# Patient Record
Sex: Female | Born: 1975 | Race: White | Hispanic: No | Marital: Single | Smoking: Never smoker
Health system: Southern US, Community
[De-identification: ages and names within clinical notes are randomized; demographics above are authoritative.]

## PROBLEM LIST (undated history)

## (undated) DIAGNOSIS — H04123 Dry eye syndrome of bilateral lacrimal glands: Secondary | ICD-10-CM

## (undated) DIAGNOSIS — L603 Nail dystrophy: Secondary | ICD-10-CM

## (undated) DIAGNOSIS — H269 Unspecified cataract: Secondary | ICD-10-CM

## (undated) DIAGNOSIS — B351 Tinea unguium: Secondary | ICD-10-CM

## (undated) DIAGNOSIS — M6281 Muscle weakness (generalized): Secondary | ICD-10-CM

## (undated) DIAGNOSIS — R600 Localized edema: Secondary | ICD-10-CM

## (undated) DIAGNOSIS — E119 Type 2 diabetes mellitus without complications: Secondary | ICD-10-CM

## (undated) DIAGNOSIS — I4891 Unspecified atrial fibrillation: Secondary | ICD-10-CM

## (undated) DIAGNOSIS — K219 Gastro-esophageal reflux disease without esophagitis: Secondary | ICD-10-CM

## (undated) DIAGNOSIS — L219 Seborrheic dermatitis, unspecified: Secondary | ICD-10-CM

## (undated) DIAGNOSIS — F419 Anxiety disorder, unspecified: Secondary | ICD-10-CM

## (undated) DIAGNOSIS — G809 Cerebral palsy, unspecified: Secondary | ICD-10-CM

## (undated) DIAGNOSIS — G8929 Other chronic pain: Secondary | ICD-10-CM

## (undated) DIAGNOSIS — F329 Major depressive disorder, single episode, unspecified: Secondary | ICD-10-CM

## (undated) DIAGNOSIS — I509 Heart failure, unspecified: Secondary | ICD-10-CM

## (undated) DIAGNOSIS — F79 Unspecified intellectual disabilities: Secondary | ICD-10-CM

## (undated) DIAGNOSIS — M810 Age-related osteoporosis without current pathological fracture: Secondary | ICD-10-CM

## (undated) DIAGNOSIS — I739 Peripheral vascular disease, unspecified: Secondary | ICD-10-CM

## (undated) DIAGNOSIS — H524 Presbyopia: Secondary | ICD-10-CM

## (undated) DIAGNOSIS — R293 Abnormal posture: Secondary | ICD-10-CM

## (undated) DIAGNOSIS — G40909 Epilepsy, unspecified, not intractable, without status epilepticus: Secondary | ICD-10-CM

## (undated) DIAGNOSIS — J309 Allergic rhinitis, unspecified: Secondary | ICD-10-CM

## (undated) DIAGNOSIS — F1320 Sedative, hypnotic or anxiolytic dependence, uncomplicated: Secondary | ICD-10-CM

## (undated) DIAGNOSIS — F29 Unspecified psychosis not due to a substance or known physiological condition: Secondary | ICD-10-CM

## (undated) DIAGNOSIS — F1120 Opioid dependence, uncomplicated: Secondary | ICD-10-CM

## (undated) DIAGNOSIS — J329 Chronic sinusitis, unspecified: Secondary | ICD-10-CM

## (undated) DIAGNOSIS — I10 Essential (primary) hypertension: Secondary | ICD-10-CM

## (undated) DIAGNOSIS — J449 Chronic obstructive pulmonary disease, unspecified: Secondary | ICD-10-CM

## (undated) DIAGNOSIS — E669 Obesity, unspecified: Secondary | ICD-10-CM

## (undated) DIAGNOSIS — R41841 Cognitive communication deficit: Secondary | ICD-10-CM

## (undated) DIAGNOSIS — M24573 Contracture, unspecified ankle: Secondary | ICD-10-CM

## (undated) DIAGNOSIS — B372 Candidiasis of skin and nail: Secondary | ICD-10-CM

## (undated) DIAGNOSIS — M419 Scoliosis, unspecified: Secondary | ICD-10-CM

## (undated) DIAGNOSIS — R1312 Dysphagia, oropharyngeal phase: Secondary | ICD-10-CM

## (undated) DIAGNOSIS — R011 Cardiac murmur, unspecified: Secondary | ICD-10-CM

## (undated) DIAGNOSIS — R569 Unspecified convulsions: Secondary | ICD-10-CM

## (undated) DIAGNOSIS — G894 Chronic pain syndrome: Secondary | ICD-10-CM

## (undated) DIAGNOSIS — R532 Functional quadriplegia: Secondary | ICD-10-CM

---

## 1998-11-30 ENCOUNTER — Ambulatory Visit (HOSPITAL_COMMUNITY): Admission: RE | Admit: 1998-11-30 | Discharge: 1998-11-30 | Payer: Self-pay | Admitting: *Deleted

## 1999-01-27 ENCOUNTER — Emergency Department (HOSPITAL_COMMUNITY): Admission: EM | Admit: 1999-01-27 | Discharge: 1999-01-27 | Payer: Self-pay | Admitting: *Deleted

## 2000-03-02 ENCOUNTER — Emergency Department (HOSPITAL_COMMUNITY): Admission: EM | Admit: 2000-03-02 | Discharge: 2000-03-03 | Payer: Self-pay | Admitting: Emergency Medicine

## 2000-03-03 ENCOUNTER — Encounter: Payer: Self-pay | Admitting: Emergency Medicine

## 2017-05-22 ENCOUNTER — Ambulatory Visit (HOSPITAL_COMMUNITY)
Admission: RE | Admit: 2017-05-22 | Discharge: 2017-05-22 | Disposition: A | Discharge status: Home / Self Care | Payer: Medicare Other | Source: Ambulatory Visit | Attending: Cardiovascular Disease | Admitting: Cardiovascular Disease

## 2017-05-22 ENCOUNTER — Ambulatory Visit (INDEPENDENT_AMBULATORY_CARE_PROVIDER_SITE_OTHER): Payer: Medicare Other | Admitting: Cardiovascular Disease

## 2017-05-22 ENCOUNTER — Encounter: Payer: Self-pay | Admitting: Cardiovascular Disease

## 2017-05-22 VITALS — BP 117/81 | HR 102

## 2017-05-22 DIAGNOSIS — I110 Hypertensive heart disease with heart failure: Secondary | ICD-10-CM | POA: Insufficient documentation

## 2017-05-22 DIAGNOSIS — I10 Essential (primary) hypertension: Secondary | ICD-10-CM | POA: Diagnosis not present

## 2017-05-22 DIAGNOSIS — I340 Nonrheumatic mitral (valve) insufficiency: Secondary | ICD-10-CM | POA: Insufficient documentation

## 2017-05-22 DIAGNOSIS — I4891 Unspecified atrial fibrillation: Secondary | ICD-10-CM

## 2017-05-22 DIAGNOSIS — R569 Unspecified convulsions: Secondary | ICD-10-CM | POA: Insufficient documentation

## 2017-05-22 DIAGNOSIS — R7989 Other specified abnormal findings of blood chemistry: Secondary | ICD-10-CM

## 2017-05-22 DIAGNOSIS — I509 Heart failure, unspecified: Secondary | ICD-10-CM | POA: Diagnosis not present

## 2017-05-22 DIAGNOSIS — G8250 Quadriplegia, unspecified: Secondary | ICD-10-CM | POA: Diagnosis not present

## 2017-05-22 DIAGNOSIS — R011 Cardiac murmur, unspecified: Secondary | ICD-10-CM | POA: Diagnosis present

## 2017-05-22 LAB — ECHOCARDIOGRAM COMPLETE

## 2017-05-22 MED ORDER — METOPROLOL TARTRATE 25 MG PO TABS: 25.0000 mg | ORAL_TABLET | Freq: Two times a day (BID) | ORAL | 3 refills | Status: AC

## 2017-05-22 NOTE — Addendum Note (Signed)
Addended by: Abelino Derrick R on: 05/22/2017 03:09 PM   Modules accepted: Orders

## 2017-05-22 NOTE — Patient Instructions (Signed)
Medication Instructions:  STOP LISINOPRIL  START METOPROLOL TO 25 MG - TAKE 1 TABLET TWICE DAILY   Labwork: NONE  Testing/Procedures: Your physician has requested that you have an echocardiogram. Echocardiography is a painless test that uses sound waves to create images of your heart. It provides your doctor with information about the size and shape of your heart and how well your heart's chambers and valves are working. This procedure takes approximately one hour. There are no restrictions for this procedure.    Follow-Up: Your physician recommends that you schedule a follow-up appointment in: 3 MONTHS    Any Other Special Instructions Will Be Listed Below (If Applicable).     If you need a refill on your cardiac medications before your next appointment, please call your pharmacy.

## 2017-05-22 NOTE — Progress Notes (Signed)
CARDIOLOGY CONSULT NOTE  Patient ID: Hailey Gutierrez MRN: [id number] DOB/AGE: 09/12/1975 41 y.o.  Admit date: (Not on file) Primary Physician: Bernerd Limbo, MD Referring Physician: Dr. Eden Emms  Reason for Consultation: CHF  HPI: Hailey Gutierrez is a 41 y.o. female who is being seen today for the evaluation of CHF at the request of Bernerd Limbo*.   Unfortunately, I have no medical records. She resides at [facility].  Labs performed on 05/16/17 showed BUN 12, creatinine 0.53, sodium 135, potassium 4.5, BNP 248. BNP on 9/18 was 474. She is currently on Lasix 20 mg daily.  It appears she has a history of hypertension and seizures based upon review of her medication list.  ECG performed in the office today which I ordered and personally interpreted demonstrated rapid atrial fibrillation, 177 bpm.  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  She is here with a cousin. She had apparently spent some time at a rehabilitation facility in [facility].  The patient denies chest pain and shortness of breath. She is tearful and says "please don't put me in the hospital ".  On physical examination, she has a loud systolic murmur but her heart rate is 108 bpm. The patient denies palpitations. She denies a history of heart disease and does not recall ever being told she has a murmur.  Repeat ECG which I ordered and personally interpreted demonstrated sinus tachycardia, 102 beats per minutes, with probable left atrial enlargement.   Not on File  No current outpatient prescriptions on file.   No current facility-administered medications for this visit.     No past medical history on file.  No past surgical history on file.  Social History   Social History  . Marital status: Single    Spouse name: N/A  . Number of children: N/A  . Years of education: N/A   Occupational History  . Not on file.   Social History Main Topics  . Smoking status: Never Smoker   . Smokeless tobacco: Never Used  . Alcohol use No  . Drug use: No  . Sexual activity: Not on file   Other Topics Concern  . Not on file   Social History Narrative  . No narrative on file     No family history of premature CAD in 1st degree relatives.  No outpatient prescriptions have been marked as taking for the 05/22/17 encounter (Office Visit) with Laqueta Linden, MD.      Review of systems complete and found to be negative unless listed above in HPI    Physical exam Blood pressure 117/81, pulse (!) 102, SpO2 96 %. General: NAD Neck: difficult to assess JVP  Lungs: Clear to auscultation anteriorly CV: Mildly tachycardic, regular rhythm, normal S1/S2, no S3/S4, 3/6 pansystolic murmur heard throughout precordium.  No leg edema.   Abdomen: Soft, nontender, protuberant. Skin: Intact without lesions or rashes.  Neurologic: Alert.  Extremities: Upper extremity contractures.  HEENT: Atraumatic.   ECG: Most recent ECG reviewed.   Labs: No results found for: K, BUN, CREATININE, ALT, TSH, HGB   Lipids: No results found for: LDLCALC, LDLDIRECT, CHOL, TRIG, HDL      ASSESSMENT AND PLAN:  1. Rapid atrial fibrillation: She is asymptomatic. Repeat ECG demonstrated sinus tachycardia, 102 bpm. She does not require anticoagulation. She is on aspirin for unclear reasons. I will discontinue lisinopril and start metoprolol titrate 25 mg twice daily. I will order a 2-D echocardiogram with Doppler to  evaluate cardiac structure, function, and regional wall motion.  2. Murmur: I will order a 2-D echocardiogram with Doppler to evaluate cardiac structure, function, and regional wall motion.  3. Elevated BNP/CHF: Currently on Lasix 20 mg daily ordered by another provider. She may have had fluid retention due to rapid atrial fibrillation. As stated above, I will stop lisinopril and start metoprolol 25 mg twice daily. I will order a 2-D echocardiogram with Doppler to evaluate cardiac  structure, function, and regional wall motion.    Disposition: Follow up in 3 months. I will try to obtain outside medical records from the rehab facility in [facility] for review.   Signed: Prentice Docker, M.D., F.A.C.C.  05/22/2017, 2:37 PM

## 2017-05-22 NOTE — Progress Notes (Signed)
*  PRELIMINARY RESULTS* Echocardiogram 2D Echocardiogram has been performed.  Jeryl Columbia 05/22/2017, 4:09 PM

## 2018-01-22 ENCOUNTER — Other Ambulatory Visit: Payer: Self-pay

## 2018-01-22 ENCOUNTER — Inpatient Hospital Stay (HOSPITAL_COMMUNITY)
Admission: EM | Admit: 2018-01-22 | Discharge: 2018-01-26 | DRG: 871 | Disposition: A | Discharge status: Skilled Nursing Facility | Payer: Medicare Other | Source: Skilled Nursing Facility | Attending: Internal Medicine | Admitting: Internal Medicine

## 2018-01-22 ENCOUNTER — Emergency Department (HOSPITAL_COMMUNITY): Payer: Medicare Other

## 2018-01-22 ENCOUNTER — Encounter (HOSPITAL_COMMUNITY): Payer: Self-pay | Admitting: Emergency Medicine

## 2018-01-22 DIAGNOSIS — R1310 Dysphagia, unspecified: Secondary | ICD-10-CM | POA: Diagnosis present

## 2018-01-22 DIAGNOSIS — J449 Chronic obstructive pulmonary disease, unspecified: Secondary | ICD-10-CM | POA: Diagnosis not present

## 2018-01-22 DIAGNOSIS — G9341 Metabolic encephalopathy: Secondary | ICD-10-CM | POA: Diagnosis not present

## 2018-01-22 DIAGNOSIS — M810 Age-related osteoporosis without current pathological fracture: Secondary | ICD-10-CM | POA: Diagnosis present

## 2018-01-22 DIAGNOSIS — Z8349 Family history of other endocrine, nutritional and metabolic diseases: Secondary | ICD-10-CM

## 2018-01-22 DIAGNOSIS — R532 Functional quadriplegia: Secondary | ICD-10-CM | POA: Diagnosis present

## 2018-01-22 DIAGNOSIS — R778 Other specified abnormalities of plasma proteins: Secondary | ICD-10-CM | POA: Diagnosis present

## 2018-01-22 DIAGNOSIS — R7989 Other specified abnormal findings of blood chemistry: Secondary | ICD-10-CM | POA: Diagnosis present

## 2018-01-22 DIAGNOSIS — I509 Heart failure, unspecified: Secondary | ICD-10-CM

## 2018-01-22 DIAGNOSIS — I10 Essential (primary) hypertension: Secondary | ICD-10-CM | POA: Diagnosis not present

## 2018-01-22 DIAGNOSIS — A4159 Other Gram-negative sepsis: Secondary | ICD-10-CM | POA: Diagnosis present

## 2018-01-22 DIAGNOSIS — Z833 Family history of diabetes mellitus: Secondary | ICD-10-CM

## 2018-01-22 DIAGNOSIS — R4182 Altered mental status, unspecified: Secondary | ICD-10-CM

## 2018-01-22 DIAGNOSIS — R1312 Dysphagia, oropharyngeal phase: Secondary | ICD-10-CM | POA: Diagnosis present

## 2018-01-22 DIAGNOSIS — I739 Peripheral vascular disease, unspecified: Secondary | ICD-10-CM | POA: Diagnosis present

## 2018-01-22 DIAGNOSIS — J690 Pneumonitis due to inhalation of food and vomit: Secondary | ICD-10-CM | POA: Diagnosis present

## 2018-01-22 DIAGNOSIS — I248 Other forms of acute ischemic heart disease: Secondary | ICD-10-CM | POA: Diagnosis present

## 2018-01-22 DIAGNOSIS — A419 Sepsis, unspecified organism: Secondary | ICD-10-CM

## 2018-01-22 DIAGNOSIS — N3000 Acute cystitis without hematuria: Secondary | ICD-10-CM

## 2018-01-22 DIAGNOSIS — G809 Cerebral palsy, unspecified: Secondary | ICD-10-CM | POA: Diagnosis not present

## 2018-01-22 DIAGNOSIS — N390 Urinary tract infection, site not specified: Secondary | ICD-10-CM | POA: Diagnosis not present

## 2018-01-22 DIAGNOSIS — B961 Klebsiella pneumoniae [K. pneumoniae] as the cause of diseases classified elsewhere: Secondary | ICD-10-CM | POA: Diagnosis present

## 2018-01-22 DIAGNOSIS — A4151 Sepsis due to Escherichia coli [E. coli]: Secondary | ICD-10-CM | POA: Diagnosis not present

## 2018-01-22 DIAGNOSIS — Z1612 Extended spectrum beta lactamase (ESBL) resistance: Secondary | ICD-10-CM | POA: Diagnosis present

## 2018-01-22 DIAGNOSIS — Z79891 Long term (current) use of opiate analgesic: Secondary | ICD-10-CM

## 2018-01-22 DIAGNOSIS — F329 Major depressive disorder, single episode, unspecified: Secondary | ICD-10-CM | POA: Diagnosis present

## 2018-01-22 DIAGNOSIS — R748 Abnormal levels of other serum enzymes: Secondary | ICD-10-CM | POA: Diagnosis not present

## 2018-01-22 DIAGNOSIS — F419 Anxiety disorder, unspecified: Secondary | ICD-10-CM | POA: Diagnosis present

## 2018-01-22 DIAGNOSIS — Z8249 Family history of ischemic heart disease and other diseases of the circulatory system: Secondary | ICD-10-CM

## 2018-01-22 DIAGNOSIS — F79 Unspecified intellectual disabilities: Secondary | ICD-10-CM | POA: Diagnosis not present

## 2018-01-22 DIAGNOSIS — Z66 Do not resuscitate: Secondary | ICD-10-CM | POA: Diagnosis present

## 2018-01-22 DIAGNOSIS — Z7982 Long term (current) use of aspirin: Secondary | ICD-10-CM

## 2018-01-22 DIAGNOSIS — I5032 Chronic diastolic (congestive) heart failure: Secondary | ICD-10-CM | POA: Diagnosis present

## 2018-01-22 DIAGNOSIS — I110 Hypertensive heart disease with heart failure: Secondary | ICD-10-CM | POA: Diagnosis present

## 2018-01-22 DIAGNOSIS — G40909 Epilepsy, unspecified, not intractable, without status epilepticus: Secondary | ICD-10-CM | POA: Diagnosis present

## 2018-01-22 DIAGNOSIS — Z841 Family history of disorders of kidney and ureter: Secondary | ICD-10-CM

## 2018-01-22 DIAGNOSIS — E1151 Type 2 diabetes mellitus with diabetic peripheral angiopathy without gangrene: Secondary | ICD-10-CM | POA: Diagnosis present

## 2018-01-22 DIAGNOSIS — R569 Unspecified convulsions: Secondary | ICD-10-CM

## 2018-01-22 DIAGNOSIS — Z7401 Bed confinement status: Secondary | ICD-10-CM

## 2018-01-22 HISTORY — DX: Major depressive disorder, single episode, unspecified: F32.9

## 2018-01-22 HISTORY — DX: Unspecified atrial fibrillation: I48.91

## 2018-01-22 HISTORY — DX: Abnormal posture: R29.3

## 2018-01-22 HISTORY — DX: Allergic rhinitis, unspecified: J30.9

## 2018-01-22 HISTORY — DX: Cerebral palsy, unspecified: G80.9

## 2018-01-22 HISTORY — DX: Age-related osteoporosis without current pathological fracture: M81.0

## 2018-01-22 HISTORY — DX: Nail dystrophy: L60.3

## 2018-01-22 HISTORY — DX: Chronic pain syndrome: G89.4

## 2018-01-22 HISTORY — DX: Presbyopia: H52.4

## 2018-01-22 HISTORY — DX: Seborrheic dermatitis, unspecified: L21.9

## 2018-01-22 HISTORY — DX: Heart failure, unspecified: I50.9

## 2018-01-22 HISTORY — DX: Cardiac murmur, unspecified: R01.1

## 2018-01-22 HISTORY — DX: Tinea unguium: B35.1

## 2018-01-22 HISTORY — DX: Dysphagia, oropharyngeal phase: R13.12

## 2018-01-22 HISTORY — DX: Type 2 diabetes mellitus without complications: E11.9

## 2018-01-22 HISTORY — DX: Chronic sinusitis, unspecified: J32.9

## 2018-01-22 HISTORY — DX: Epilepsy, unspecified, not intractable, without status epilepticus: G40.909

## 2018-01-22 HISTORY — DX: Unspecified cataract: H26.9

## 2018-01-22 HISTORY — DX: Unspecified convulsions: R56.9

## 2018-01-22 HISTORY — DX: Candidiasis of skin and nail: B37.2

## 2018-01-22 HISTORY — DX: Chronic obstructive pulmonary disease, unspecified: J44.9

## 2018-01-22 HISTORY — DX: Gastro-esophageal reflux disease without esophagitis: K21.9

## 2018-01-22 HISTORY — DX: Sedative, hypnotic or anxiolytic dependence, uncomplicated: F13.20

## 2018-01-22 HISTORY — DX: Unspecified psychosis not due to a substance or known physiological condition: F29

## 2018-01-22 HISTORY — DX: Other chronic pain: G89.29

## 2018-01-22 HISTORY — DX: Peripheral vascular disease, unspecified: I73.9

## 2018-01-22 HISTORY — DX: Obesity, unspecified: E66.9

## 2018-01-22 HISTORY — DX: Opioid dependence, uncomplicated: F11.20

## 2018-01-22 HISTORY — DX: Anxiety disorder, unspecified: F41.9

## 2018-01-22 HISTORY — DX: Functional quadriplegia: R53.2

## 2018-01-22 HISTORY — DX: Localized edema: R60.0

## 2018-01-22 HISTORY — DX: Contracture, unspecified ankle: M24.573

## 2018-01-22 HISTORY — DX: Cognitive communication deficit: R41.841

## 2018-01-22 HISTORY — DX: Essential (primary) hypertension: I10

## 2018-01-22 HISTORY — DX: Scoliosis, unspecified: M41.9

## 2018-01-22 HISTORY — DX: Muscle weakness (generalized): M62.81

## 2018-01-22 HISTORY — DX: Dry eye syndrome of bilateral lacrimal glands: H04.123

## 2018-01-22 HISTORY — DX: Unspecified intellectual disabilities: F79

## 2018-01-22 LAB — URINALYSIS, ROUTINE W REFLEX MICROSCOPIC
Bilirubin Urine: NEGATIVE
GLUCOSE, UA: NEGATIVE mg/dL
KETONES UR: NEGATIVE mg/dL
NITRITE: POSITIVE — AB
PROTEIN: 100 mg/dL — AB
Specific Gravity, Urine: 1.016 (ref 1.005–1.030)
pH: 5 (ref 5.0–8.0)

## 2018-01-22 LAB — COMPREHENSIVE METABOLIC PANEL
ALT: 12 U/L — ABNORMAL LOW (ref 14–54)
AST: 22 U/L (ref 15–41)
Albumin: 3.7 g/dL (ref 3.5–5.0)
Alkaline Phosphatase: 105 U/L (ref 38–126)
Anion gap: 13 (ref 5–15)
BUN: 17 mg/dL (ref 6–20)
CHLORIDE: 108 mmol/L (ref 101–111)
CO2: 21 mmol/L — ABNORMAL LOW (ref 22–32)
Calcium: 9.2 mg/dL (ref 8.9–10.3)
Creatinine, Ser: 0.73 mg/dL (ref 0.44–1.00)
Glucose, Bld: 136 mg/dL — ABNORMAL HIGH (ref 65–99)
POTASSIUM: 4 mmol/L (ref 3.5–5.1)
Sodium: 142 mmol/L (ref 135–145)
Total Bilirubin: 0.7 mg/dL (ref 0.3–1.2)
Total Protein: 8 g/dL (ref 6.5–8.1)

## 2018-01-22 LAB — CBC WITH DIFFERENTIAL/PLATELET
BASOS PCT: 0 %
Basophils Absolute: 0 10*3/uL (ref 0.0–0.1)
Eosinophils Absolute: 0 10*3/uL (ref 0.0–0.7)
Eosinophils Relative: 0 %
HEMATOCRIT: 42.3 % (ref 36.0–46.0)
Hemoglobin: 13.4 g/dL (ref 12.0–15.0)
Lymphocytes Relative: 6 %
Lymphs Abs: 0.8 10*3/uL (ref 0.7–4.0)
MCH: 29.2 pg (ref 26.0–34.0)
MCHC: 31.7 g/dL (ref 30.0–36.0)
MCV: 92.2 fL (ref 78.0–100.0)
MONO ABS: 1 10*3/uL (ref 0.1–1.0)
MONOS PCT: 7 %
NEUTROS ABS: 11.9 10*3/uL — AB (ref 1.7–7.7)
Neutrophils Relative %: 87 %
Platelets: 220 10*3/uL (ref 150–400)
RBC: 4.59 MIL/uL (ref 3.87–5.11)
RDW: 14.6 % (ref 11.5–15.5)
WBC: 13.7 10*3/uL — ABNORMAL HIGH (ref 4.0–10.5)

## 2018-01-22 LAB — CBG MONITORING, ED: GLUCOSE-CAPILLARY: 124 mg/dL — AB (ref 65–99)

## 2018-01-22 LAB — TROPONIN I
Troponin I: 0.13 ng/mL (ref <0.03)
Troponin I: 0.16 ng/mL (ref <0.03)

## 2018-01-22 MED ORDER — LISINOPRIL 5 MG PO TABS
2.5000 mg | ORAL_TABLET | Freq: Every day | ORAL | Status: DC
  Administered 2018-01-23: 2.5 mg via ORAL
  Filled 2018-01-22: qty 1

## 2018-01-22 MED ORDER — SALINE SPRAY 0.65 % NA SOLN: 1.0000 | NASAL | Status: DC | PRN

## 2018-01-22 MED ORDER — FUROSEMIDE 20 MG PO TABS
20.0000 mg | ORAL_TABLET | Freq: Every day | ORAL | Status: DC
  Administered 2018-01-23: 20 mg via ORAL
  Filled 2018-01-22: qty 1

## 2018-01-22 MED ORDER — BACLOFEN 10 MG PO TABS
10.0000 mg | ORAL_TABLET | Freq: Three times a day (TID) | ORAL | Status: DC
  Administered 2018-01-22 – 2018-01-26 (×11): 10 mg via ORAL
  Filled 2018-01-22 (×11): qty 1

## 2018-01-22 MED ORDER — POLYETHYLENE GLYCOL 3350 17 G PO PACK
17.0000 g | PACK | Freq: Every day | ORAL | Status: DC
  Administered 2018-01-23 – 2018-01-26 (×4): 17 g via ORAL
  Filled 2018-01-22 (×4): qty 1

## 2018-01-22 MED ORDER — IPRATROPIUM-ALBUTEROL 0.5-2.5 (3) MG/3ML IN SOLN: 3.0000 mL | RESPIRATORY_TRACT | Status: DC | PRN

## 2018-01-22 MED ORDER — ACETAMINOPHEN 325 MG PO TABS
650.0000 mg | ORAL_TABLET | Freq: Once | ORAL | Status: AC
  Administered 2018-01-22: 650 mg via ORAL

## 2018-01-22 MED ORDER — CALCIUM CARBONATE ANTACID 500 MG PO CHEW
1.0000 | CHEWABLE_TABLET | Freq: Two times a day (BID) | ORAL | Status: DC
  Administered 2018-01-22 – 2018-01-26 (×8): 200 mg via ORAL
  Filled 2018-01-22 (×8): qty 1

## 2018-01-22 MED ORDER — ASPIRIN 81 MG PO CHEW
324.0000 mg | CHEWABLE_TABLET | Freq: Once | ORAL | Status: AC
  Administered 2018-01-22: 324 mg via ORAL
  Filled 2018-01-22: qty 4

## 2018-01-22 MED ORDER — SODIUM CHLORIDE 0.9 % IV SOLN: 250.0000 mL | INTRAVENOUS | Status: DC | PRN

## 2018-01-22 MED ORDER — SODIUM CHLORIDE 0.9 % IV SOLN
1.0000 g | Freq: Once | INTRAVENOUS | Status: AC
  Administered 2018-01-22: 1 g via INTRAVENOUS
  Filled 2018-01-22: qty 10

## 2018-01-22 MED ORDER — BUSPIRONE HCL 5 MG PO TABS
10.0000 mg | ORAL_TABLET | Freq: Three times a day (TID) | ORAL | Status: DC
Start: 2018-01-22 — End: 2018-01-26
  Administered 2018-01-22 – 2018-01-26 (×11): 10 mg via ORAL
  Filled 2018-01-22 (×8): qty 2
  Filled 2018-01-22 (×2): qty 1
  Filled 2018-01-22 (×3): qty 2

## 2018-01-22 MED ORDER — METOPROLOL TARTRATE 25 MG PO TABS
25.0000 mg | ORAL_TABLET | Freq: Two times a day (BID) | ORAL | Status: DC
  Administered 2018-01-22 – 2018-01-26 (×8): 25 mg via ORAL
  Filled 2018-01-22 (×8): qty 1

## 2018-01-22 MED ORDER — MONTELUKAST SODIUM 10 MG PO TABS
10.0000 mg | ORAL_TABLET | Freq: Every day | ORAL | Status: DC
  Administered 2018-01-22 – 2018-01-25 (×4): 10 mg via ORAL
  Filled 2018-01-22 (×4): qty 1

## 2018-01-22 MED ORDER — ESCITALOPRAM OXALATE 10 MG PO TABS
10.0000 mg | ORAL_TABLET | Freq: Every day | ORAL | Status: DC
  Administered 2018-01-23 – 2018-01-26 (×4): 10 mg via ORAL
  Filled 2018-01-22 (×4): qty 1

## 2018-01-22 MED ORDER — ACETAMINOPHEN 325 MG PO TABS
650.0000 mg | ORAL_TABLET | ORAL | Status: DC | PRN
  Administered 2018-01-23: 650 mg via ORAL
  Filled 2018-01-22 (×2): qty 2

## 2018-01-22 MED ORDER — SODIUM CHLORIDE 0.9% FLUSH
3.0000 mL | Freq: Two times a day (BID) | INTRAVENOUS | Status: DC
  Administered 2018-01-22 – 2018-01-26 (×8): 3 mL via INTRAVENOUS

## 2018-01-22 MED ORDER — ARIPIPRAZOLE 5 MG PO TABS
5.0000 mg | ORAL_TABLET | Freq: Every day | ORAL | Status: DC
  Administered 2018-01-22 – 2018-01-26 (×5): 5 mg via ORAL
  Filled 2018-01-22 (×5): qty 1

## 2018-01-22 MED ORDER — KETOCONAZOLE 2 % EX SHAM
1.0000 "application " | MEDICATED_SHAMPOO | CUTANEOUS | Status: DC
  Administered 2018-01-22 – 2018-01-25 (×2): 1 via TOPICAL
  Filled 2018-01-22 (×2): qty 120

## 2018-01-22 MED ORDER — MEDROXYPROGESTERONE ACETATE 150 MG/ML IM SUSP: 150.0000 mg | INTRAMUSCULAR | Status: DC

## 2018-01-22 MED ORDER — SODIUM CHLORIDE 0.9 % IV SOLN
1.0000 g | INTRAVENOUS | Status: DC
  Administered 2018-01-23: 1 g via INTRAVENOUS
  Filled 2018-01-22: qty 1
  Filled 2018-01-22: qty 10

## 2018-01-22 MED ORDER — VITAMIN D 1000 UNITS PO TABS
5000.0000 [IU] | ORAL_TABLET | Freq: Every day | ORAL | Status: DC
  Administered 2018-01-23 – 2018-01-26 (×4): 5000 [IU] via ORAL
  Filled 2018-01-22 (×4): qty 5

## 2018-01-22 MED ORDER — OXYCODONE HCL 5 MG PO TABS
5.0000 mg | ORAL_TABLET | Freq: Three times a day (TID) | ORAL | Status: DC
  Administered 2018-01-22 – 2018-01-26 (×11): 5 mg via ORAL
  Filled 2018-01-22 (×12): qty 1

## 2018-01-22 MED ORDER — SODIUM CHLORIDE 0.9% FLUSH: 3.0000 mL | INTRAVENOUS | Status: DC | PRN

## 2018-01-22 MED ORDER — LEVETIRACETAM 100 MG/ML PO SOLN
500.0000 mg | Freq: Two times a day (BID) | ORAL | Status: DC
  Administered 2018-01-22 – 2018-01-26 (×8): 500 mg via ORAL
  Filled 2018-01-22 (×12): qty 5

## 2018-01-22 MED ORDER — DOCUSATE SODIUM 100 MG PO CAPS
100.0000 mg | ORAL_CAPSULE | Freq: Two times a day (BID) | ORAL | Status: DC
  Administered 2018-01-22 – 2018-01-26 (×8): 100 mg via ORAL
  Filled 2018-01-22 (×8): qty 1

## 2018-01-22 MED ORDER — ASPIRIN 81 MG PO CHEW
81.0000 mg | CHEWABLE_TABLET | Freq: Every day | ORAL | Status: DC
  Administered 2018-01-23 – 2018-01-26 (×4): 81 mg via ORAL
  Filled 2018-01-22 (×4): qty 1

## 2018-01-22 MED ORDER — OXYCODONE-ACETAMINOPHEN 5-325 MG PO TABS: 1.0000 | ORAL_TABLET | ORAL | Status: DC | PRN

## 2018-01-22 NOTE — ED Triage Notes (Addendum)
[facility] with altered mental status since last night. States she is normally verbal, alert and oriented. States patient is just moaning today and unable to communicate verbally. Patient shook head "no" when asked if she had pain.

## 2018-01-22 NOTE — H&P (Signed)
History and Physical    Hailey Gutierrez:[id number] DOB: 09-16-1975 DOA: 01/22/2018  PCP: Bernerd Limbo, MD  Patient coming from: [facility]  Chief Complaint: Altered mental status  HPI: Hailey Gutierrez is a 42 y.o. female with medical history significant of cerebral palsy, functional quadriplegic, mental retardation, hypertension, seizures, congestive heart failure, comes in with altered mental status this morning.  All history is obtained from her cousin who is present.  Patient is on various antibiotics at her nursing home with various different types of infections.  She comes in this morning because of altered mental status.  She does not require frequent hospitalization.  She has not had any nausea vomiting or diarrhea.  Her cousin says that her mental status is much improved since this morning.  Patient found to have UTI with a fever of 103 and referred for admission for such.  Patient cannot provide any history herself.  Review of Systems: As per HPI otherwise 10 point review of systems negative per cousin  Past Medical History:  Diagnosis Date  . Abnormal posture   . Allergic rhinitis   . Anxiety   . Atrial fibrillation (HCC)   . Candidiasis of skin and nail   . Cardiac murmur   . Cataract   . Cerebral palsy (HCC)   . CHF (congestive heart failure) (HCC)   . Chronic pain   . Chronic pain syndrome   . Chronic sinusitis   . Cognitive communication deficit   . Contracture, unspecified ankle   . COPD (chronic obstructive pulmonary disease) (HCC)   . Diabetes mellitus without complication (HCC)   . Dry eye syndrome of bilateral lacrimal glands   . Dysphagia, oropharyngeal phase   . Epilepsy (HCC)   . Essential hypertension   . Functional quadriplegia (HCC)   . GERD (gastroesophageal reflux disease)   . Heart failure (HCC)   . Localized edema   . Major depressive disorder   . Muscle weakness (generalized)   . Nail dystrophy   . Obesity   . Opioid  dependence (HCC)   . Osteoporosis   . Presbyopia   . PVD (peripheral vascular disease) (HCC)   . Scoliosis   . Seborrheic dermatitis   . Sedative hypnotic or anxiolytic dependence (HCC)   . Seizures (HCC)   . Sinusitis   . Tinea unguium   . Unspecified intellectual disabilities   . Unspecified psychosis not due to a substance or known physiological condition [facility])     History reviewed. No pertinent surgical history.   reports that she has never smoked. She has never used smokeless tobacco. She reports that she does not drink alcohol or use drugs.  No Known Allergies  Family History  Problem Relation Age of Onset  . Heart attack Mother   . Diabetes Mother   . Chronic Renal Failure Mother   . Hypertension Father   . Hyperlipidemia Father     Prior to Admission medications   Medication Sig Start Date End Date Taking? Authorizing Provider  acetaminophen (TYLENOL) 325 MG tablet Take 650 mg by mouth every 4 (four) hours as needed for fever (101 or higher).   Yes [provider]  ARIPiprazole (ABILIFY) 5 MG tablet Take 5 mg by mouth daily.   Yes [provider]  aspirin 81 MG chewable tablet Chew 81 mg by mouth daily.   Yes [provider]  baclofen (LIORESAL) 10 MG tablet Take 10 mg by mouth 3 (three) times daily.   Yes  [provider]  busPIRone (BUSPAR) 10 MG tablet Take 10 mg by mouth 3 (three) times daily.   Yes [provider]  calcium carbonate (TUMS - DOSED IN MG ELEMENTAL CALCIUM) 500 MG chewable tablet Chew 1 tablet by mouth 2 (two) times daily.   Yes [provider]  Cholecalciferol 5000 units capsule Take 5,000 Units by mouth daily.   Yes [provider]  docusate sodium (COLACE) 100 MG capsule Take 100 mg by mouth 2 (two) times daily.   Yes [provider]  escitalopram (LEXAPRO) 10 MG tablet Take 10 mg by mouth daily.   Yes [provider]  fexofenadine (ALLEGRA) 60 MG tablet Take 60 mg by  mouth 2 (two) times daily.   Yes [provider]  fluocinonide (LIDEX) 0.05 % external solution Apply 1 application topically daily as needed (itching in both ears).   Yes [provider]  fluticasone (FLONASE) 50 MCG/ACT nasal spray Place into both nostrils daily.   Yes [provider]  furosemide (LASIX) 20 MG tablet Take 20 mg by mouth.   Yes [provider]  ipratropium-albuterol (DUONEB) 0.5-2.5 (3) MG/3ML SOLN Take 3 mLs by nebulization every 4 (four) hours as needed (SOB or Wheezing).   Yes [provider]  ketoconazole (NIZORAL) 2 % shampoo Apply 1 application topically 2 (two) times a week. In the afternoon every Tuesday and friday   Yes [provider]  levETIRAcetam (KEPPRA) 100 MG/ML solution Take 500 mg by mouth 2 (two) times daily.   Yes [provider]  lisinopril (PRINIVIL,ZESTRIL) 2.5 MG tablet Take 2.5 mg by mouth daily.   Yes [provider]  medroxyPROGESTERone (DEPO-PROVERA) 150 MG/ML injection Inject 150 mg into the muscle every 3 (three) months.   Yes [provider]  metoprolol tartrate (LOPRESSOR) 25 MG tablet Take 1 tablet (25 mg total) by mouth 2 (two) times daily. 05/22/17 01/23/19 Yes Laqueta Linden, MD  montelukast (SINGULAIR) 10 MG tablet Take 10 mg by mouth at bedtime.   Yes [provider]  nystatin (MYCOSTATIN/NYSTOP) powder Apply 1 g topically 2 (two) times daily.   Yes [provider]  nystatin cream (MYCOSTATIN) Apply 1 application topically 2 (two) times daily. Apply to left cheek (face) until resolved   Yes [provider]  oxyCODONE (OXY IR/ROXICODONE) 5 MG immediate release tablet Take 5 mg by mouth 3 (three) times daily.   Yes [provider]  oxyCODONE-acetaminophen (PERCOCET/ROXICET) 5-325 MG tablet Take 1 tablet by mouth every 4 (four) hours as needed for severe pain.   Yes [provider]  polyethylene glycol (MIRALAX / GLYCOLAX)  packet Take 17 g by mouth daily.   Yes [provider]  ranitidine (ZANTAC) 75 MG tablet Take 75 mg by mouth 2 (two) times daily.   Yes [provider]  sodium chloride (OCEAN) 0.65 % SOLN nasal spray Place 1 spray into both nostrils as needed (nasal dryness).   Yes [provider]    Physical Exam: Vitals:   01/22/18 1515 01/22/18 1530 01/22/18 1718 01/22/18 1719  BP:   98/61   Pulse: 83 81  (!) 105  Resp: (!) 26 (!) 26 16 (!) 27  Temp:      TempSrc:      SpO2: 95% 96%  96%  Weight:      Height:        Constitutional: NAD, calm, comfortable Vitals:   01/22/18 1515 01/22/18 1530 01/22/18 1718 01/22/18 1719  BP:  98/61   Pulse: 83 81  (!) 105  Resp: (!) 26 (!) 26 16 (!) 27  Temp:      TempSrc:      SpO2: 95% 96%  96%  Weight:      Height:       Eyes: PERRL, lids and conjunctivae normal ENMT: Mucous membranes are moist. Posterior pharynx clear of any exudate or lesions.Normal dentition.  Neck: normal, supple, no masses, no thyromegaly Respiratory: clear to auscultation bilaterally, no wheezing, no crackles. Normal respiratory effort. No accessory muscle use.  Cardiovascular: Regular rate and rhythm, no murmurs / rubs / gallops. No extremity edema. 2+ pedal pulses. No carotid bruits.  Abdomen: no tenderness, no masses palpated. No hepatosplenomegaly. Bowel sounds positive.  Musculoskeletal: no clubbing / cyanosis. No joint deformity upper and lower extremities. Good ROM, no contractures. Normal muscle tone.  Skin: no rashes, lesions, ulcers. No induration Neurologic: CN 2-12 grossly intact. Sensation intact Psychiatric: Not normal judgment and insight. Alert and oriented x 1. Normal mood.    Labs on Admission: I have personally reviewed following labs and imaging studies  CBC: Recent Labs  Lab 01/22/18 1553  WBC 13.7*  NEUTROABS 11.9*  HGB 13.4  HCT 42.3  MCV 92.2  PLT 220   Basic Metabolic Panel: Recent Labs  Lab 01/22/18 1436  NA  142  K 4.0  CL 108  CO2 21*  GLUCOSE 136*  BUN 17  CREATININE 0.73  CALCIUM 9.2   GFR: Estimated Creatinine Clearance: 98.5 mL/min (by C-G formula based on SCr of 0.73 mg/dL). Liver Function Tests: Recent Labs  Lab 01/22/18 1436  AST 22  ALT 12*  ALKPHOS 105  BILITOT 0.7  PROT 8.0  ALBUMIN 3.7   No results for input(s): LIPASE, AMYLASE in the last 168 hours. No results for input(s): AMMONIA in the last 168 hours. Coagulation Profile: No results for input(s): INR, PROTIME in the last 168 hours. Cardiac Enzymes: Recent Labs  Lab 01/22/18 1436  TROPONINI 0.13*   BNP (last 3 results) No results for input(s): PROBNP in the last 8760 hours. HbA1C: No results for input(s): HGBA1C in the last 72 hours. CBG: Recent Labs  Lab 01/22/18 1425  GLUCAP 124*   Lipid Profile: No results for input(s): CHOL, HDL, LDLCALC, TRIG, CHOLHDL, LDLDIRECT in the last 72 hours. Thyroid Function Tests: No results for input(s): TSH, T4TOTAL, FREET4, T3FREE, THYROIDAB in the last 72 hours. Anemia Panel: No results for input(s): VITAMINB12, FOLATE, FERRITIN, TIBC, IRON, RETICCTPCT in the last 72 hours. Urine analysis:    Component Value Date/Time   COLORURINE AMBER (A) 01/22/2018 1321   APPEARANCEUR CLOUDY (A) 01/22/2018 1321   LABSPEC 1.016 01/22/2018 1321   PHURINE 5.0 01/22/2018 1321   GLUCOSEU NEGATIVE 01/22/2018 1321   HGBUR LARGE (A) 01/22/2018 1321   BILIRUBINUR NEGATIVE 01/22/2018 1321   KETONESUR NEGATIVE 01/22/2018 1321   PROTEINUR 100 (A) 01/22/2018 1321   NITRITE POSITIVE (A) 01/22/2018 1321   LEUKOCYTESUR SMALL (A) 01/22/2018 1321   Sepsis Labs: !!!!!!!!!!!!!!!!!!!!!!!!!!!!!!!!!!!!!!!!!!!! @LABRCNTIP (procalcitonin:4,lacticidven:4) )No results found for this or any previous visit (from the past 240 hour(s)).   Radiological Exams on Admission: Dg Chest 1 View  Result Date: 01/22/2018 CLINICAL DATA:  Altered mental status. EXAM: CHEST  1 VIEW COMPARISON:  Radiograph of  March 02, 2010. FINDINGS: Stable moderate cardiomegaly. No pneumothorax or pleural effusion is noted. Both lungs are clear. The visualized skeletal structures are unremarkable. IMPRESSION: No acute cardiopulmonary abnormality seen. Electronically Signed   By: Fayrene Fearing  Christen ButterGreen Jr, M.D.   On: 01/22/2018 13:54   Ct Head Wo Contrast  Result Date: 01/22/2018 CLINICAL DATA:  42 year old female with altered mental status this morning. Chronic quadriplegia. EXAM: CT HEAD WITHOUT CONTRAST TECHNIQUE: Contiguous axial images were obtained from the base of the skull through the vertex without intravenous contrast. [facility] without [facility]/22/2011. FINDINGS: Brain: Cerebral volume is not significantly changed since 2012. Mildly dysplastic appearing lateral ventricles with mild ex vacuo enlargement, with superimposed corona radiata and deep white matter capsule encephalomalacia on the left. Stable somewhat disproportionate cerebellar volume loss, more so in the right hemisphere which may relate to Wallerian degeneration. No midline shift, mass effect, evidence of mass lesion, intracranial hemorrhage or evidence of cortically based acute infarction. Vascular: No suspicious intracranial vascular hyperdensity. Skull: No acute osseous abnormality identified. Sinuses/Orbits: Visualized paranasal sinuses and mastoids are stable and well pneumatized. Other: Disconjugate gaze similar to the prior study. No acute orbit or scalp soft tissue findings. The IMPRESSION: 1.  No acute intracranial abnormality. 2. Overall stable non contrast CT appearance of the brain since 2012. 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 Electronically Signed   By: Odessa FlemingH  Hall M.D.   On: 01/22/2018 13:58    EKG:  Independently reviewed.  Normal sinus rhythm no acute changes Old chart reviewed  Case discussed with EDP Dr. Charm BargesButler Chest x-ray reviewed no edema or infiltrate   Assessment/Plan 42 year old female with metabolic encephalopathy from UTI Principal Problem:   UTI (urinary tract infection)-obtain urine culture.  IV Rocephin.  Vital signs stable.  Active Problems:   Elevated troponin-serial troponin.  If significantly rises do further work-up.   continue aspirin.   Acute metabolic encephalopathy-seems to resolved and back at baseline was likely due to fever   Unspecified intellectual disabilities all stable-   PVD (peripheral vascular disease) (HCC)-stable   Essential hypertension-continue home meds   Functional quadriplegia (HCC)-noted   Seizures (HCC)-continue home meds   COPD (chronic obstructive pulmonary disease) (HCC)- stable  CHF (congestive heart failure) (HCC) continue Lasix-   Cerebral palsy (HCC)-stable     DVT prophylaxis: SCDs Code Status: DNR Family Communication: Cousin Disposition Plan: Per day team Consults called: None Admission status: Observation   Draydon Clairmont A MD [facility]  If 7PM-7AM, please contact night-coverage [email or web address] Password [email or web address]  01/22/2018, 6:01 PM

## 2018-01-22 NOTE — ED Notes (Signed)
In and out cathed for urine.  Per cousin,  Pt is starting to act more like herself.

## 2018-01-22 NOTE — ED Provider Notes (Signed)
[facility] EMERGENCY DEPARTMENT Provider Note   CSN: [id number] Arrival date & time: 01/22/18  1259     History   Chief Complaint Chief Complaint  Patient presents with  . Altered Mental Status    HPI Hailey Gutierrez is a 42 y.o. female.  She is being brought in from her nursing facility for altered mental status.  Level 5 caveat secondary to altered mental status.  Her cousin is here and is helping assist with communication.  At baseline she is verbal and able to have a conversation although does have some baseline slurred speech.  She has limited use of her upper extremities and no use of her lower extremities.  She is bedbound at baseline.  Per the cousin she visited the patient last night and brought her pizza which they had.  After leaving the patient was reportedly moaning and yelling although then was noted to sleep well during the night.  Today she was not as communicative as baseline.  Here in the ED the patient will answer some simple questions and states she is scared and that something happened with her roommate but will not give any further information.  She seems to be denying any pain.  The history is provided by the patient, the EMS personnel and a relative. The history is limited by the condition of the patient. No language interpreter was used.  Altered Mental Status   This is a new problem. The current episode started yesterday. The problem has been gradually improving. Pertinent negatives include no seizures, no unresponsiveness, no weakness and no agitation. Her past medical history is significant for seizures.    Past Medical History:  Diagnosis Date  . Abnormal posture   . Allergic rhinitis   . Anxiety   . Atrial fibrillation (HCC)   . Candidiasis of skin and nail   . Cardiac murmur   . Cataract   . Cerebral palsy (HCC)   . CHF (congestive heart failure) (HCC)   . Chronic pain   . Chronic pain syndrome   . Chronic sinusitis   . Cognitive communication  deficit   . Contracture, unspecified ankle   . COPD (chronic obstructive pulmonary disease) (HCC)   . Diabetes mellitus without complication (HCC)   . Dry eye syndrome of bilateral lacrimal glands   . Dysphagia, oropharyngeal phase   . Epilepsy (HCC)   . Essential hypertension   . Functional quadriplegia (HCC)   . GERD (gastroesophageal reflux disease)   . Heart failure (HCC)   . Localized edema   . Major depressive disorder   . Muscle weakness (generalized)   . Nail dystrophy   . Obesity   . Opioid dependence (HCC)   . Osteoporosis   . Presbyopia   . PVD (peripheral vascular disease) (HCC)   . Scoliosis   . Seborrheic dermatitis   . Sedative hypnotic or anxiolytic dependence (HCC)   . Seizures (HCC)   . Sinusitis   . Tinea unguium   . Unspecified intellectual disabilities   . Unspecified psychosis not due to a substance or known physiological condition [facility])     Patient Active Problem List   Diagnosis Date Noted  . Sepsis due to undetermined organism (HCC) 01/23/2018  . UTI (urinary tract infection) 01/22/2018  . Elevated troponin 01/22/2018  . Acute metabolic encephalopathy 01/22/2018  . Unspecified intellectual disabilities   . PVD (peripheral vascular disease) (HCC)   . Essential hypertension   . Functional quadriplegia (HCC)   . Seizures (HCC)   .  COPD (chronic obstructive pulmonary disease) (HCC)   . CHF (congestive heart failure) (HCC)   . Cerebral palsy (HCC)     History reviewed. No pertinent surgical history.   OB History   None      Home Medications    Prior to Admission medications   Medication Sig Start Date End Date Taking? Authorizing Provider  acetaminophen (TYLENOL) 500 MG tablet Take 500 mg by mouth every 6 (six) hours as needed.    [provider]  ARIPiprazole (ABILIFY) 5 MG tablet Take 5 mg by mouth daily.    [provider]  aspirin EC 81 MG tablet Take 81 mg by mouth daily.    [provider]  baclofen  (LIORESAL) 10 MG tablet Take 10 mg by mouth 3 (three) times daily.    [provider]  busPIRone (BUSPAR) 10 MG tablet Take 10 mg by mouth 3 (three) times daily.    [provider]  calcium carbonate (TUMS - DOSED IN MG ELEMENTAL CALCIUM) 500 MG chewable tablet Chew 1 tablet by mouth 2 (two) times daily.    [provider]  docusate sodium (COLACE) 100 MG capsule Take 100 mg by mouth 2 (two) times daily.    [provider]  escitalopram (LEXAPRO) 10 MG tablet Take 10 mg by mouth daily.    [provider]  fexofenadine (ALLEGRA) 60 MG tablet Take 60 mg by mouth 2 (two) times daily.    [provider]  fluticasone (FLONASE) 50 MCG/ACT nasal spray Place into both nostrils daily.    [provider]  furosemide (LASIX) 20 MG tablet Take 20 mg by mouth.    [provider]  levETIRAcetam (KEPPRA) 100 MG/ML solution Take 500 mg by mouth 2 (two) times daily.    [provider]  medroxyPROGESTERone (DEPO-PROVERA) 150 MG/ML injection Inject 150 mg into the muscle every 3 (three) months.    [provider]  metoprolol tartrate (LOPRESSOR) 25 MG tablet Take 1 tablet (25 mg total) by mouth 2 (two) times daily. 05/22/17 08/20/17  Laqueta LindenKoneswaran, Suresh A, MD  montelukast (SINGULAIR) 10 MG tablet Take 10 mg by mouth at bedtime.    [provider]  Multiple Vitamin (MULTIVITAMIN) tablet Take 1 tablet by mouth daily.    [provider]  nystatin (MYCOSTATIN) 100000 UNIT/ML suspension Take 5 mLs by mouth 4 (four) times daily.    [provider]  oxyCODONE-acetaminophen (PERCOCET/ROXICET) 5-325 MG tablet Take 1 tablet by mouth every 4 (four) hours as needed for severe pain.    [provider]    Family History Family History  Problem Relation Age of Onset  . Heart attack Mother   . Diabetes Mother   . Chronic Renal Failure Mother   . Hypertension Father   . Hyperlipidemia Father     Social  History Social History   Tobacco Use  . Smoking status: Never Smoker  . Smokeless tobacco: Never Used  Substance Use Topics  . Alcohol use: No  . Drug use: No     Allergies   Patient has no known allergies.   Review of Systems Review of Systems  Unable to perform ROS: Mental status change  Neurological: Negative for seizures and weakness.  Psychiatric/Behavioral: Negative for agitation.     Physical Exam Updated Vital Signs BP 104/85 (BP Location: Right Arm)   Pulse 81   Temp 98 F (36.7 C) (Oral)   Resp (!) 26   Ht 4\' 11"  (1.499 m)  Wt 108.9 kg (240 lb)   SpO2 96%   BMI 48.47 kg/m   Physical Exam  Constitutional: She appears well-developed and well-nourished. No distress.  HENT:  Head: Normocephalic and atraumatic.  Right Ear: External ear normal.  Left Ear: External ear normal.  Nose: Nose normal.  Mouth/Throat: Oropharynx is clear and moist.  Eyes: Pupils are equal, round, and reactive to light. Conjunctivae are normal.  Disconjugate gaze which is baseline per the cousin.  Neck: Normal range of motion. No tracheal deviation present.  Cardiovascular: Normal rate, regular rhythm, normal heart sounds and intact distal pulses.  3 out of 6 systolic murmur.  Pulmonary/Chest: Effort normal. No respiratory distress. She has no wheezes. She has no rales.  Abdominal: Soft. She exhibits no mass. There is no tenderness. There is no guarding.  Musculoskeletal:  She has contractures of upper and lower extremities.  No obvious hot red joints.  Neurological: She is alert. GCS eye subscore is 4. GCS verbal subscore is 5. GCS motor subscore is 6.  She has baseline quadriplegia and has baseline disconjugate gaze.  It is difficult to ascertain whether she has a new neurologic deficit but per the cousin she seems at baseline other than her to communicate.  Even this seems better than earlier today is now she is at least offering some answers to questions.     ED Treatments /  Results  Labs (all labs ordered are listed, but only abnormal results are displayed) Labs Reviewed  URINE CULTURE - Abnormal; Notable for the following components:      Result Value   Culture   (*)    Value: >=100,000 COLONIES/mL GRAM NEGATIVE RODS 50,000 COLONIES/mL GRAM NEGATIVE RODS    All other components within normal limits  COMPREHENSIVE METABOLIC PANEL - Abnormal; Notable for the following components:   CO2 21 (*)    Glucose, Bld 136 (*)    ALT 12 (*)    All other components within normal limits  URINALYSIS, ROUTINE W REFLEX MICROSCOPIC - Abnormal; Notable for the following components:   Color, Urine AMBER (*)    APPearance CLOUDY (*)    Hgb urine dipstick LARGE (*)    Protein, ur 100 (*)    Nitrite POSITIVE (*)    Leukocytes, UA SMALL (*)    Bacteria, UA FEW (*)    All other components within normal limits  TROPONIN I - Abnormal; Notable for the following components:   Troponin I 0.13 (*)    All other components within normal limits  CBC WITH DIFFERENTIAL/PLATELET - Abnormal; Notable for the following components:   WBC 13.7 (*)    Neutro Abs 11.9 (*)    All other components within normal limits  TROPONIN I - Abnormal; Notable for the following components:   Troponin I 0.16 (*)    All other components within normal limits  TROPONIN I - Abnormal; Notable for the following components:   Troponin I 0.16 (*)    All other components within normal limits  TROPONIN I - Abnormal; Notable for the following components:   Troponin I 0.17 (*)    All other components within normal limits  BASIC METABOLIC PANEL - Abnormal; Notable for the following components:   Glucose, Bld 125 (*)    All other components within normal limits  CBC - Abnormal; Notable for the following components:   WBC 13.5 (*)    Hemoglobin 11.2 (*)    HCT 35.3 (*)    All other components within normal  limits  BASIC METABOLIC PANEL - Abnormal; Notable for the following components:   Potassium 3.2 (*)     All other components within normal limits  CBC - Abnormal; Notable for the following components:   Hemoglobin 11.4 (*)    HCT 35.5 (*)    All other components within normal limits  CBG MONITORING, ED - Abnormal; Notable for the following components:   Glucose-Capillary 124 (*)    All other components within normal limits  MRSA PCR SCREENING  CULTURE, BLOOD (ROUTINE X 2)  CULTURE, BLOOD (ROUTINE X 2)  PROCALCITONIN  LACTIC ACID, PLASMA  PROCALCITONIN  CBC WITH DIFFERENTIAL/PLATELET    EKG EKG Interpretation  Date/Time:  Tuesday January 22 2018 14:03:43 EDT Ventricular Rate:  79 PR Interval:    QRS Duration: 133 QT Interval:  410 QTC Calculation: 470 R Axis:   43 Text Interpretation:  Sinus rhythm LAE, consider biatrial enlargement Nonspecific intraventricular conduction delay Nonspecific repol abnormality, diffuse leads no prior to compare with Confirmed by Meridee Score [phone]) on 01/22/2018 2:12:53 PM   Radiology Dg Chest 1 View  Result Date: 01/22/2018 CLINICAL DATA:  Altered mental status. EXAM: CHEST  1 VIEW COMPARISON:  Radiograph of March 02, 2010. FINDINGS: Stable moderate cardiomegaly. No pneumothorax or pleural effusion is noted. Both lungs are clear. The visualized skeletal structures are unremarkable. IMPRESSION: No acute cardiopulmonary abnormality seen. Electronically Signed   By: Lupita Raider, M.D.   On: 01/22/2018 13:54   Ct Head Wo Contrast  Result Date: 01/22/2018 CLINICAL DATA:  42 year old female with altered mental status this morning. Chronic quadriplegia. EXAM: CT HEAD WITHOUT CONTRAST TECHNIQUE: Contiguous axial images were obtained from the base of the skull through the vertex without intravenous contrast. COMPARISON:  [facility] CT without contrast 03/22/2011. FINDINGS: Brain: Cerebral volume is not significantly changed since 2012. Mildly dysplastic appearing lateral ventricles with mild ex vacuo enlargement, with superimposed corona radiata and  deep white matter capsule encephalomalacia on the left. Stable somewhat disproportionate cerebellar volume loss, more so in the right hemisphere which may relate to Wallerian degeneration. No midline shift, mass effect, evidence of mass lesion, intracranial hemorrhage or evidence of cortically based acute infarction. Vascular: No suspicious intracranial vascular hyperdensity. Skull: No acute osseous abnormality identified. Sinuses/Orbits: Visualized paranasal sinuses and mastoids are stable and well pneumatized. Other: Disconjugate gaze similar to the prior study. No acute orbit or scalp soft tissue findings. The IMPRESSION: 1.  No acute intracranial abnormality. 2. Overall stable non contrast CT appearance of the brain since 2012. 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 Electronically Signed   By: Odessa Fleming M.D.   On: 01/22/2018 13:58    Procedures Procedures (including critical care time)  Medications Ordered in ED Medications - No data to display   Initial Impression / Assessment and Plan / ED Course  I have reviewed the triage vital signs and the nursing notes.  Pertinent labs & imaging results that were available during my care of the patient were reviewed by me and considered in my medical decision making (see chart for details).  Clinical Course as of Jan 24 1149  Tue Jan 22, 2018  1419 Patient's cousin is here and able to provide a little background.  Patient here is intermittently crying out in pain but it seems to be obvious stimuli like when she was getting her  IV placed.  In between these episodes she appears in no distress.  Per the cousin she is better than when she was at the facility but not back to her baseline.  It is unclear whether some medical complaints  that is driving this behavioral/mental status change or this is more emotional/enviornmental    [MB]  1654 Patient's been a difficult IV stick and labs have been slow to coming back.  Her CAT scan of her head and her chest x-ray were unremarkable.  Her urine looks positive with 21-50 whites and nitrite positive so we are covering her with some ceftriaxone.  If her labs are otherwise unremarkable think she could be safely discharged back to her facility on some oral antibiotics to complete the course.  As far as her mental status and her cousin since she is not completely back to normal but improved from this morning.   [MB]  1704 Patient trop is back and is 0.13.  She has an EKG that shows some diffuse ST depressions.  There is no old one to compare with no prior tropes.  Think ultimately she will need to come in because it is very difficult to get any real story from her otherwise she does not feel well.   [MB]  1710 I updated the patient and the cousin on the findings of the troponin and they are in agreement that we can admit her to the hospital for further work-up.   [MB]  1717 Gust with Dr. Onalee Hua from the hospitalist service.  She is asking if I can review this with cardiology to find out if they would rather her down in the [facility] system.  Cardiology paged.   [MB]  1828 Discussed with Dr. Duke Salvia from [facility] cardiology.  She agrees with current management of trending her tropes and if there was a significant bump or if her echo was abnormal tomorrow in conjunction with cardiology consult tomorrow here they may consider being more aggressive about this.  But currently she does not think she needs to be transferred down to [facility].   [MB]    Clinical Course User Index [MB] Terrilee Files, MD     Final Clinical Impressions(s) / ED Diagnoses   Final diagnoses:  Altered mental status  Unspecified intellectual disabilities  PVD (peripheral vascular disease) (HCC)  Essential  hypertension  Functional quadriplegia (HCC)  Seizures (HCC)  Chronic obstructive pulmonary disease, unspecified COPD type (HCC)  Congestive heart failure, unspecified HF chronicity, unspecified heart failure type (HCC)  Cerebral palsy, unspecified type [facility])    ED Discharge Orders    None       Terrilee Files, MD 01/24/18 1152

## 2018-01-22 NOTE — ED Notes (Signed)
CRITICAL VALUE ALERT  Critical Value:  Troponin 0.13  Date & Time Notied:  01/22/2018 at 1700  Provider Notified: Dr. Charm BargesButler   Orders Received/Actions taken:

## 2018-01-23 ENCOUNTER — Observation Stay (HOSPITAL_BASED_OUTPATIENT_CLINIC_OR_DEPARTMENT_OTHER): Payer: Medicare Other

## 2018-01-23 DIAGNOSIS — R569 Unspecified convulsions: Secondary | ICD-10-CM

## 2018-01-23 DIAGNOSIS — J449 Chronic obstructive pulmonary disease, unspecified: Secondary | ICD-10-CM

## 2018-01-23 DIAGNOSIS — A4151 Sepsis due to Escherichia coli [E. coli]: Secondary | ICD-10-CM | POA: Diagnosis not present

## 2018-01-23 DIAGNOSIS — R748 Abnormal levels of other serum enzymes: Secondary | ICD-10-CM | POA: Diagnosis not present

## 2018-01-23 DIAGNOSIS — A419 Sepsis, unspecified organism: Secondary | ICD-10-CM

## 2018-01-23 DIAGNOSIS — G9341 Metabolic encephalopathy: Secondary | ICD-10-CM

## 2018-01-23 DIAGNOSIS — R532 Functional quadriplegia: Secondary | ICD-10-CM | POA: Diagnosis not present

## 2018-01-23 DIAGNOSIS — N390 Urinary tract infection, site not specified: Secondary | ICD-10-CM | POA: Diagnosis not present

## 2018-01-23 DIAGNOSIS — I10 Essential (primary) hypertension: Secondary | ICD-10-CM

## 2018-01-23 DIAGNOSIS — I5030 Unspecified diastolic (congestive) heart failure: Secondary | ICD-10-CM | POA: Diagnosis not present

## 2018-01-23 DIAGNOSIS — G809 Cerebral palsy, unspecified: Secondary | ICD-10-CM | POA: Diagnosis not present

## 2018-01-23 DIAGNOSIS — R319 Hematuria, unspecified: Secondary | ICD-10-CM

## 2018-01-23 LAB — BASIC METABOLIC PANEL
ANION GAP: 9 (ref 5–15)
BUN: 16 mg/dL (ref 6–20)
CALCIUM: 8.9 mg/dL (ref 8.9–10.3)
CO2: 27 mmol/L (ref 22–32)
CREATININE: 0.7 mg/dL (ref 0.44–1.00)
Chloride: 102 mmol/L (ref 101–111)
GFR calc Af Amer: >60 mL/min (ref >60)
Glucose, Bld: 125 mg/dL — ABNORMAL HIGH (ref 65–99)
Potassium: 3.5 mmol/L (ref 3.5–5.1)
Sodium: 138 mmol/L (ref 135–145)

## 2018-01-23 LAB — CBC
HCT: 35.3 % — ABNORMAL LOW (ref 36.0–46.0)
Hemoglobin: 11.2 g/dL — ABNORMAL LOW (ref 12.0–15.0)
MCH: 28.6 pg (ref 26.0–34.0)
MCHC: 31.7 g/dL (ref 30.0–36.0)
MCV: 90.3 fL (ref 78.0–100.0)
PLATELETS: 225 10*3/uL (ref 150–400)
RBC: 3.91 MIL/uL (ref 3.87–5.11)
RDW: 14.7 % (ref 11.5–15.5)
WBC: 13.5 10*3/uL — ABNORMAL HIGH (ref 4.0–10.5)

## 2018-01-23 LAB — ECHOCARDIOGRAM COMPLETE
HEIGHTINCHES: 59 in
WEIGHTICAEL: 3840 [oz_av]

## 2018-01-23 LAB — TROPONIN I
TROPONIN I: 0.16 ng/mL — AB (ref <0.03)
TROPONIN I: 0.17 ng/mL — AB (ref <0.03)

## 2018-01-23 LAB — PROCALCITONIN: Procalcitonin: 21.96 ng/mL

## 2018-01-23 LAB — MRSA PCR SCREENING: MRSA BY PCR: NEGATIVE

## 2018-01-23 LAB — LACTIC ACID, PLASMA: LACTIC ACID, VENOUS: 1.5 mmol/L (ref 0.5–1.9)

## 2018-01-23 NOTE — Care Management Obs Status (Signed)
MEDICARE OBSERVATION STATUS NOTIFICATION   Patient Details  Name: Hailey HindsLabreeska D Slowey MRN: [id number] Date of Birth: 04/05/1976   Medicare Observation Status Notification Given:  Yes    Malcolm MetroChildress, Trena Dunavan Demske, RN 01/23/2018, 9:05 AM

## 2018-01-23 NOTE — Progress Notes (Signed)
PROGRESS NOTE  Ruta HindsLabreeska D Millican FAO:[id number] DOB: 08/12/1976 DOA: 01/22/2018 PCP: Bernerd LimboAriza, Fernando Enrique, MD  Brief History:  42 year old female with a history of cerebral palsy, functional quadriplegia, seizure disorder, hypertension, anxiety, diastolic CHF, opioid dependence presenting with altered mental status at her nursing facility on the morning of 01/22/2018.  Initial lab work showed WBC 13.7 with urinalysis suggestive of UTI.  CT of the brain was negative for acute findings.  Patient was started on ceftriaxone after urine cultures were obtained.  Assessment/Plan: Acute metabolic encephalopathy -Secondary to infectious process -Patient is more alert 01/23/2018 -CT brain negative for acute findings  Sepsis -Present at the time of admission -Procalcitonin 21.96 -Continue IV fluids -Lactic acid peaked 1.5 -Continue ceftriaxone pending culture data  Elevated troponin -No chest pain presently -EKG without concerning ischemic changes -01/23/2018 echo EF 60-65%, grade 2 DD, no WMA -Due to demand ischemia  Dysphagia -speech therapy eval  Seizure disorder -Continue Keppra  Essential hypertension -Continue metoprolol tartrate and lisinopril  Chronic opioid dependence -continue home doses of oxycodone  Depression/anxiety -Continue Lexapro and BuSpar  Functional quadriplegia -PT evaluation when stabilized    Disposition Plan:  SNF in 2-3 days  Family Communication:   No Family at bedside  Consultants:  none  Code Status:  FULL  DVT Prophylaxis:  SCDs   Procedures: As Listed in Progress Note Above  Antibiotics: Ceftriaxone 6/4>>>    Subjective: Patient is awake and alert.  She only intermittently answers questions patient denies any chest pain or abdominal pain.  There is no nausea, vomiting.  Remainder review systems unobtainable.  Objective: Vitals:   01/23/18 1400 01/23/18 1600 01/23/18 1612 01/23/18 1740  BP: 104/60 (!) 108/51    Pulse: 77  85    Resp: 18 19    Temp: 98.5 F (36.9 C) (!) 103.2 F (39.6 C) (!) 101.9 F (38.8 C) (!) 100.7 F (38.2 C)  TempSrc: Oral Oral Oral Axillary  SpO2: 95% 97%    Weight:      Height:        Intake/Output Summary (Last 24 hours) at 01/23/2018 1812 Last data filed at 01/23/2018 1653 Gross per 24 hour  Intake 100 ml  Output 130 ml  Net -30 ml   Weight change:  Exam:   General:  Pt is alert, does not follow commands appropriately, not in acute distress  HEENT: No icterus, No thrush, No neck mass, [place]/AT  Cardiovascular: RRR, S1/S2, no rubs, no gallops  Respiratory: Poor respiratory effort.  Bibasilar rales.  No wheezing.  Abdomen: Soft/+BS, non tender, non distended, no guarding  Extremities: trace LE edema, No lymphangitis, No petechiae, No rashes, no synovitis   Data Reviewed: I have personally reviewed following labs and imaging studies Basic Metabolic Panel: Recent Labs  Lab 01/22/18 1436 01/23/18 0621  NA 142 138  K 4.0 3.5  CL 108 102  CO2 21* 27  GLUCOSE 136* 125*  BUN 17 16  CREATININE 0.73 0.70  CALCIUM 9.2 8.9   Liver Function Tests: Recent Labs  Lab 01/22/18 1436  AST 22  ALT 12*  ALKPHOS 105  BILITOT 0.7  PROT 8.0  ALBUMIN 3.7   No results for input(s): LIPASE, AMYLASE in the last 168 hours. No results for input(s): AMMONIA in the last 168 hours. Coagulation Profile: No results for input(s): INR, PROTIME in the last 168 hours. CBC: Recent Labs  Lab 01/22/18 1553 01/23/18 0621  WBC 13.7* 13.5*  NEUTROABS 11.9*  --   HGB 13.4 11.2*  HCT 42.3 35.3*  MCV 92.2 90.3  PLT 220 225   Cardiac Enzymes: Recent Labs  Lab 01/22/18 1436 01/22/18 1817 01/23/18 0015 01/23/18 0621  TROPONINI 0.13* 0.16* 0.16* 0.17*   BNP: Invalid input(s): POCBNP CBG: Recent Labs  Lab 01/22/18 1425  GLUCAP 124*   HbA1C: No results for input(s): HGBA1C in the last 72 hours. Urine analysis:    Component Value Date/Time   COLORURINE AMBER (A)  01/22/2018 1321   APPEARANCEUR CLOUDY (A) 01/22/2018 1321   LABSPEC 1.016 01/22/2018 1321   PHURINE 5.0 01/22/2018 1321   GLUCOSEU NEGATIVE 01/22/2018 1321   HGBUR LARGE (A) 01/22/2018 1321   BILIRUBINUR NEGATIVE 01/22/2018 1321   KETONESUR NEGATIVE 01/22/2018 1321   PROTEINUR 100 (A) 01/22/2018 1321   NITRITE POSITIVE (A) 01/22/2018 1321   LEUKOCYTESUR SMALL (A) 01/22/2018 1321   Sepsis Labs: @LABRCNTIP (procalcitonin:4,lacticidven:4) ) Recent Results (from the past 240 hour(s))  Urine culture     Status: None (Preliminary result)   Collection Time: 01/22/18  4:02 PM  Result Value Ref Range Status   Specimen Description   Final    URINE, CATHETERIZED Performed at [facility], [address], [place], [place] [address]    Special Requests   Final    Normal Performed at [facility], [address] [place], [place] [address]    Culture   Final    CULTURE REINCUBATED FOR BETTER GROWTH Performed at [facility], [address] [place], [place] [address]    Report Status PENDING  Incomplete  MRSA PCR Screening     Status: None   Collection Time: 01/22/18 10:30 PM  Result Value Ref Range Status   MRSA by PCR NEGATIVE NEGATIVE Final    Comment:        The GeneXpert MRSA Assay (FDA approved for NASAL specimens only), is one component of a comprehensive MRSA colonization surveillance program. It is not intended to diagnose MRSA infection nor to guide or monitor treatment for MRSA infections. Performed at [facility], [address], [place], [place] [address]   Culture, blood (Routine X 2) w Reflex to ID Panel     Status: None (Preliminary result)   Collection Time: 01/23/18  7:18 AM  Result Value Ref Range Status   Specimen Description BLOOD RIGHT HAND  Final   Special Requests   Final    BOTTLES DRAWN AEROBIC ONLY Blood Culture adequate volume Performed at [facility], [address] [place], [place] [address]    Culture PENDING  Incomplete   Report Status  PENDING  Incomplete     Scheduled Meds: . ARIPiprazole  5 mg Oral Daily  . aspirin  81 mg Oral Daily  . baclofen  10 mg Oral TID  . busPIRone  10 mg Oral TID  . calcium carbonate  1 tablet Oral BID  . cholecalciferol  5,000 Units Oral Daily  . docusate sodium  100 mg Oral BID  . escitalopram  10 mg Oral Daily  . furosemide  20 mg Oral Daily  . ketoconazole  1 application Topical Once per day on Tue Fri  . levETIRAcetam  500 mg Oral BID  . lisinopril  2.5 mg Oral Daily  . metoprolol tartrate  25 mg Oral BID  . montelukast  10 mg Oral QHS  . oxyCODONE  5 mg Oral Q8H  . polyethylene glycol  17 g Oral Daily  . sodium chloride flush  3 mL  Intravenous Q12H   Continuous Infusions: . sodium chloride    . cefTRIAXone (ROCEPHIN)  IV Stopped (01/23/18 1740)    Procedures/Studies: Dg Chest 1 View  Result Date: 01/22/2018 CLINICAL DATA:  Altered mental status. EXAM: CHEST  1 VIEW COMPARISON:  Radiograph of March 02, 2010. FINDINGS: Stable moderate cardiomegaly. No pneumothorax or pleural effusion is noted. Both lungs are clear. The visualized skeletal structures are unremarkable. IMPRESSION: No acute cardiopulmonary abnormality seen. Electronically Signed   By: Lupita Raider, M.D.   On: 01/22/2018 13:54   Ct Head Wo Contrast  Result Date: 01/22/2018 CLINICAL DATA:  42 year old female with altered mental status this morning. Chronic quadriplegia. EXAM: CT HEAD WITHOUT CONTRAST TECHNIQUE: Contiguous axial images were obtained from the base of the skull through the vertex without intravenous contrast. COMPARISON:  [facility] CT without contrast 03/22/2011. FINDINGS: Brain: Cerebral volume is not significantly changed since 2012. Mildly dysplastic appearing lateral ventricles with mild ex vacuo enlargement, with superimposed corona radiata and deep white matter capsule encephalomalacia on the left. Stable somewhat disproportionate cerebellar volume loss, more so in the right hemisphere  which may relate to Wallerian degeneration. No midline shift, mass effect, evidence of mass lesion, intracranial hemorrhage or evidence of cortically based acute infarction. Vascular: No suspicious intracranial vascular hyperdensity. Skull: No acute osseous abnormality identified. Sinuses/Orbits: Visualized paranasal sinuses and mastoids are stable and well pneumatized. Other: Disconjugate gaze similar to the prior study. No acute orbit or scalp soft tissue findings. The IMPRESSION: 1.  No acute intracranial abnormality. 2. Overall stable non contrast CT appearance of the brain since 2012. 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 Electronically Signed   By: Odessa Fleming M.D.   On: 01/22/2018 13:58    Catarina Hartshorn, DO  [facility] Pager [phone]  If 7PM-7AM, please contact night-coverage [email or web address] Password [email or web address] 01/23/2018, 6:12 PM   LOS: 0 days

## 2018-01-23 NOTE — Progress Notes (Signed)
*  PRELIMINARY RESULTS* Echocardiogram 2D Echocardiogram has been performed.  Jeryl Columbialliott, Kinzlee Selvy 01/23/2018, 10:44 AM

## 2018-01-24 DIAGNOSIS — E1151 Type 2 diabetes mellitus with diabetic peripheral angiopathy without gangrene: Secondary | ICD-10-CM | POA: Diagnosis present

## 2018-01-24 DIAGNOSIS — F329 Major depressive disorder, single episode, unspecified: Secondary | ICD-10-CM | POA: Diagnosis present

## 2018-01-24 DIAGNOSIS — B961 Klebsiella pneumoniae [K. pneumoniae] as the cause of diseases classified elsewhere: Secondary | ICD-10-CM | POA: Diagnosis present

## 2018-01-24 DIAGNOSIS — I248 Other forms of acute ischemic heart disease: Secondary | ICD-10-CM | POA: Diagnosis present

## 2018-01-24 DIAGNOSIS — F79 Unspecified intellectual disabilities: Secondary | ICD-10-CM | POA: Diagnosis present

## 2018-01-24 DIAGNOSIS — R1310 Dysphagia, unspecified: Secondary | ICD-10-CM | POA: Diagnosis present

## 2018-01-24 DIAGNOSIS — J690 Pneumonitis due to inhalation of food and vomit: Secondary | ICD-10-CM | POA: Diagnosis present

## 2018-01-24 DIAGNOSIS — I110 Hypertensive heart disease with heart failure: Secondary | ICD-10-CM | POA: Diagnosis present

## 2018-01-24 DIAGNOSIS — Z66 Do not resuscitate: Secondary | ICD-10-CM | POA: Diagnosis present

## 2018-01-24 DIAGNOSIS — Z79891 Long term (current) use of opiate analgesic: Secondary | ICD-10-CM | POA: Diagnosis not present

## 2018-01-24 DIAGNOSIS — G809 Cerebral palsy, unspecified: Secondary | ICD-10-CM | POA: Diagnosis present

## 2018-01-24 DIAGNOSIS — J449 Chronic obstructive pulmonary disease, unspecified: Secondary | ICD-10-CM | POA: Diagnosis present

## 2018-01-24 DIAGNOSIS — G9341 Metabolic encephalopathy: Secondary | ICD-10-CM | POA: Diagnosis present

## 2018-01-24 DIAGNOSIS — I10 Essential (primary) hypertension: Secondary | ICD-10-CM | POA: Diagnosis not present

## 2018-01-24 DIAGNOSIS — N390 Urinary tract infection, site not specified: Secondary | ICD-10-CM | POA: Diagnosis present

## 2018-01-24 DIAGNOSIS — R1312 Dysphagia, oropharyngeal phase: Secondary | ICD-10-CM | POA: Diagnosis present

## 2018-01-24 DIAGNOSIS — Z1612 Extended spectrum beta lactamase (ESBL) resistance: Secondary | ICD-10-CM | POA: Diagnosis present

## 2018-01-24 DIAGNOSIS — R748 Abnormal levels of other serum enzymes: Secondary | ICD-10-CM | POA: Diagnosis not present

## 2018-01-24 DIAGNOSIS — R532 Functional quadriplegia: Secondary | ICD-10-CM | POA: Diagnosis present

## 2018-01-24 DIAGNOSIS — I5032 Chronic diastolic (congestive) heart failure: Secondary | ICD-10-CM | POA: Diagnosis present

## 2018-01-24 DIAGNOSIS — G40909 Epilepsy, unspecified, not intractable, without status epilepticus: Secondary | ICD-10-CM | POA: Diagnosis present

## 2018-01-24 DIAGNOSIS — A4159 Other Gram-negative sepsis: Secondary | ICD-10-CM | POA: Diagnosis present

## 2018-01-24 DIAGNOSIS — F419 Anxiety disorder, unspecified: Secondary | ICD-10-CM | POA: Diagnosis present

## 2018-01-24 DIAGNOSIS — Z7982 Long term (current) use of aspirin: Secondary | ICD-10-CM | POA: Diagnosis not present

## 2018-01-24 DIAGNOSIS — Z7401 Bed confinement status: Secondary | ICD-10-CM | POA: Diagnosis not present

## 2018-01-24 DIAGNOSIS — A4151 Sepsis due to Escherichia coli [E. coli]: Secondary | ICD-10-CM | POA: Diagnosis present

## 2018-01-24 LAB — BASIC METABOLIC PANEL
ANION GAP: 8 (ref 5–15)
BUN: 15 mg/dL (ref 6–20)
CALCIUM: 9 mg/dL (ref 8.9–10.3)
CO2: 27 mmol/L (ref 22–32)
Chloride: 104 mmol/L (ref 101–111)
Creatinine, Ser: 0.57 mg/dL (ref 0.44–1.00)
GFR calc Af Amer: >60 mL/min (ref >60)
Glucose, Bld: 99 mg/dL (ref 65–99)
POTASSIUM: 3.2 mmol/L — AB (ref 3.5–5.1)
SODIUM: 139 mmol/L (ref 135–145)

## 2018-01-24 LAB — CBC
HEMATOCRIT: 35.5 % — AB (ref 36.0–46.0)
Hemoglobin: 11.4 g/dL — ABNORMAL LOW (ref 12.0–15.0)
MCH: 28.6 pg (ref 26.0–34.0)
MCHC: 32.1 g/dL (ref 30.0–36.0)
MCV: 89.2 fL (ref 78.0–100.0)
Platelets: 194 10*3/uL (ref 150–400)
RBC: 3.98 MIL/uL (ref 3.87–5.11)
RDW: 14.4 % (ref 11.5–15.5)
WBC: 7.3 10*3/uL (ref 4.0–10.5)

## 2018-01-24 LAB — PROCALCITONIN: PROCALCITONIN: 11.56 ng/mL

## 2018-01-24 MED ORDER — PIPERACILLIN-TAZOBACTAM 3.375 G IVPB
3.3750 g | Freq: Three times a day (TID) | INTRAVENOUS | Status: DC
Start: 2018-01-24 — End: 2018-01-25
  Administered 2018-01-24 – 2018-01-25 (×3): 3.375 g via INTRAVENOUS
  Filled 2018-01-24 (×3): qty 50

## 2018-01-24 MED ORDER — POTASSIUM CHLORIDE CRYS ER 20 MEQ PO TBCR
40.0000 meq | EXTENDED_RELEASE_TABLET | Freq: Once | ORAL | Status: AC
  Administered 2018-01-24: 40 meq via ORAL
  Filled 2018-01-24: qty 2

## 2018-01-24 NOTE — Progress Notes (Signed)
PROGRESS NOTE  Hailey Gutierrez RUE:[id number] DOB: 12/03/1975 DOA: 01/22/2018 PCP: Bernerd LimboAriza, Fernando Enrique, MD  Brief History:  42 year old female with a history of cerebral palsy, functional quadriplegia, seizure disorder, hypertension, anxiety, diastolic CHF, opioid dependence presenting with altered mental status at her nursing facility on the morning of 01/22/2018.  Initial lab work showed WBC 13.7 with urinalysis suggestive of UTI.  CT of the brain was negative for acute findings.  Patient was started on ceftriaxone after urine cultures were obtained.  Assessment/Plan: Acute metabolic encephalopathy -Secondary to infectious process -Patient now back to baseline -CT brain negative for acute findings  Sepsis -Present at the time of admission -Procalcitonin 21.96 -Continue IV fluids -Lactic acid peaked 1.5 -due to persistent fever, broaden abx coverage due to high risk for MDR organism  Aspiration pneumonitis -start zosyn -speech therapy eval--dys 3 with thin liquids  Elevated troponin -No chest pain presently -EKG without concerning ischemic changes -01/23/2018 echo EF 60-65%, grade 2 DD, no WMA -Due to demand ischemia  Dysphagia -speech therapy eval--dys 3 with thin liquids  Seizure disorder -Continue Keppra  Essential hypertension -Continue metoprolol tartrate and lisinopril  Chronic opioid dependence -continue home doses of oxycodone  Depression/anxiety -Continue Lexapro and BuSpar  Functional quadriplegia -PT evaluation when stabilized    Disposition Plan:  SNF in 1-2 days  Family Communication:   No Family at bedside  Consultants:  none  Code Status:  FULL  DVT Prophylaxis:  SCDs   Procedures: As Listed in Progress Note Above  Antibiotics: Ceftriaxone 6/4>>>6/6 Zosyn 6/6>>      Subjective: Review of systems is limited secondary to patient's cognitive impairment.  She denies any chest pain, shortness breath,  abdominal pain, vomiting.  Objective: Vitals:   01/23/18 1740 01/23/18 2232 01/24/18 0600 01/24/18 1800  BP:  (!) 105/55 125/69   Pulse:  65 72 (!) 57  Resp:  18 20 16   Temp: (!) 100.7 F (38.2 C) 98.7 F (37.1 C) 98.8 F (37.1 C) 99.6 F (37.6 C)  TempSrc: Axillary Oral Oral Axillary  SpO2:   94% 93%  Weight:      Height:        Intake/Output Summary (Last 24 hours) at 01/24/2018 1830 Last data filed at 01/24/2018 1633 Gross per 24 hour  Intake 290 ml  Output -  Net 290 ml   Weight change:  Exam:   General:  Pt is alert, follows commands appropriately, not in acute distress  HEENT: No icterus, No thrush, No neck mass, [place]/AT  Cardiovascular: RRR, S1/S2, no rubs, no gallops  Respiratory: Bibasilar rales.  No wheezing.  Good air movement.  Abdomen: Soft/+BS, non tender, non distended, no guarding  Extremities: 1 +LE edema, No lymphangitis, No petechiae, No rashes, no synovitis   Data Reviewed: I have personally reviewed following labs and imaging studies Basic Metabolic Panel: Recent Labs  Lab 01/22/18 1436 01/23/18 0621 01/24/18 0753  NA 142 138 139  K 4.0 3.5 3.2*  CL 108 102 104  CO2 21* 27 27  GLUCOSE 136* 125* 99  BUN 17 16 15   CREATININE 0.73 0.70 0.57  CALCIUM 9.2 8.9 9.0   Liver Function Tests: Recent Labs  Lab 01/22/18 1436  AST 22  ALT 12*  ALKPHOS 105  BILITOT 0.7  PROT 8.0  ALBUMIN 3.7   No results for input(s): LIPASE, AMYLASE in the last 168 hours. No results for input(s): AMMONIA in the last 168 hours. Coagulation  Profile: No results for input(s): INR, PROTIME in the last 168 hours. CBC: Recent Labs  Lab 01/22/18 1553 01/23/18 0621 01/24/18 0753  WBC 13.7* 13.5* 7.3  NEUTROABS 11.9*  --   --   HGB 13.4 11.2* 11.4*  HCT 42.3 35.3* 35.5*  MCV 92.2 90.3 89.2  PLT 220 225 194   Cardiac Enzymes: Recent Labs  Lab 01/22/18 1436 01/22/18 1817 01/23/18 0015 01/23/18 0621  TROPONINI 0.13* 0.16* 0.16* 0.17*   BNP: Invalid  input(s): POCBNP CBG: Recent Labs  Lab 01/22/18 1425  GLUCAP 124*   HbA1C: No results for input(s): HGBA1C in the last 72 hours. Urine analysis:    Component Value Date/Time   COLORURINE AMBER (A) 01/22/2018 1321   APPEARANCEUR CLOUDY (A) 01/22/2018 1321   LABSPEC 1.016 01/22/2018 1321   PHURINE 5.0 01/22/2018 1321   GLUCOSEU NEGATIVE 01/22/2018 1321   HGBUR LARGE (A) 01/22/2018 1321   BILIRUBINUR NEGATIVE 01/22/2018 1321   KETONESUR NEGATIVE 01/22/2018 1321   PROTEINUR 100 (A) 01/22/2018 1321   NITRITE POSITIVE (A) 01/22/2018 1321   LEUKOCYTESUR SMALL (A) 01/22/2018 1321   Sepsis Labs: @LABRCNTIP (procalcitonin:4,lacticidven:4) ) Recent Results (from the past 240 hour(s))  Urine culture     Status: Abnormal (Preliminary result)   Collection Time: 01/22/18  4:02 PM  Result Value Ref Range Status   Specimen Description   Final    URINE, CATHETERIZED Performed at [facility], [address] [place], [place] [address]    Special Requests   Final    Normal Performed at [facility], [address], [place], [place] [address]    Culture (A)  Final    >=100,000 COLONIES/mL ESCHERICHIA COLI 50,000 COLONIES/mL KLEBSIELLA PNEUMONIAE    Report Status PENDING  Incomplete  MRSA PCR Screening     Status: None   Collection Time: 01/22/18 10:30 PM  Result Value Ref Range Status   MRSA by PCR NEGATIVE NEGATIVE Final    Comment:        The GeneXpert MRSA Assay (FDA approved for NASAL specimens only), is one component of a comprehensive MRSA colonization surveillance program. It is not intended to diagnose MRSA infection nor to guide or monitor treatment for MRSA infections. Performed at [facility], [address], [place], [place] [address]   Culture, blood (Routine X 2) w Reflex to ID Panel     Status: None (Preliminary result)   Collection Time: 01/23/18  7:18 AM  Result Value Ref Range Status   Specimen Description BLOOD RIGHT HAND  Final   Special Requests   Final      BOTTLES DRAWN AEROBIC ONLY Blood Culture adequate volume Performed at [facility], [address] [place], [place] [address]    Culture PENDING  Incomplete   Report Status PENDING  Incomplete  Culture, blood (Routine X 2) w Reflex to ID Panel     Status: None (Preliminary result)   Collection Time: 01/23/18  9:19 PM  Result Value Ref Range Status   Specimen Description BLOOD RIGHT HAND  Final   Special Requests   Final    BOTTLES DRAWN AEROBIC AND ANAEROBIC Blood Culture adequate volume Performed at [facility], [address] [place], [place] [address]    Culture PENDING  Incomplete   Report Status PENDING  Incomplete     Scheduled Meds: . ARIPiprazole  5 mg Oral Daily  . aspirin  81 mg Oral Daily  . baclofen  10 mg Oral TID  . busPIRone  10 mg Oral TID  .  calcium carbonate  1 tablet Oral BID  . cholecalciferol  5,000 Units Oral Daily  . docusate sodium  100 mg Oral BID  . escitalopram  10 mg Oral Daily  . ketoconazole  1 application Topical Once per day on Tue Fri  . levETIRAcetam  500 mg Oral BID  . metoprolol tartrate  25 mg Oral BID  . montelukast  10 mg Oral QHS  . oxyCODONE  5 mg Oral Q8H  . polyethylene glycol  17 g Oral Daily  . sodium chloride flush  3 mL Intravenous Q12H   Continuous Infusions: . sodium chloride    . piperacillin-tazobactam (ZOSYN)  IV 3.375 g (01/24/18 1551)    Procedures/Studies: Dg Chest 1 View  Result Date: 01/22/2018 CLINICAL DATA:  Altered mental status. EXAM: CHEST  1 VIEW COMPARISON:  Radiograph of March 02, 2010. FINDINGS: Stable moderate cardiomegaly. No pneumothorax or pleural effusion is noted. Both lungs are clear. The visualized skeletal structures are unremarkable. IMPRESSION: No acute cardiopulmonary abnormality seen. Electronically Signed   By: Lupita Raider, M.D.   On: 01/22/2018 13:54   Ct Head Wo Contrast  Result Date: 01/22/2018 CLINICAL DATA:  42 year old female with altered mental status this morning. Chronic  quadriplegia. EXAM: CT HEAD WITHOUT CONTRAST TECHNIQUE: Contiguous axial images were obtained from the base of the skull through the vertex without intravenous contrast. COMPARISON:  [facility] CT without contrast 03/22/2011. FINDINGS: Brain: Cerebral volume is not significantly changed since 2012. Mildly dysplastic appearing lateral ventricles with mild ex vacuo enlargement, with superimposed corona radiata and deep white matter capsule encephalomalacia on the left. Stable somewhat disproportionate cerebellar volume loss, more so in the right hemisphere which may relate to Wallerian degeneration. No midline shift, mass effect, evidence of mass lesion, intracranial hemorrhage or evidence of cortically based acute infarction. Vascular: No suspicious intracranial vascular hyperdensity. Skull: No acute osseous abnormality identified. Sinuses/Orbits: Visualized paranasal sinuses and mastoids are stable and well pneumatized. Other: Disconjugate gaze similar to the prior study. No acute orbit or scalp soft tissue findings. The IMPRESSION: 1.  No acute intracranial abnormality. 2. Overall stable non contrast CT appearance of the brain since 2012. 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 Electronically Signed   By: Odessa Fleming M.D.   On: 01/22/2018 13:58    Catarina Hartshorn, DO  [facility] Pager [phone]  If 7PM-7AM, please contact night-coverage [email or web address] Password [email or web address] 01/24/2018, 6:30 PM   LOS: 0 days

## 2018-01-24 NOTE — Evaluation (Signed)
Clinical/Bedside Swallow Evaluation Patient Details  Name: Hailey Gutierrez MRN: [id number] Date of Birth: 08/22/1975  Today's Date: 01/24/2018 Time: SLP Start Time (ACUTE ONLY): 1300 SLP Stop Time (ACUTE ONLY): 1330 SLP Time Calculation (min) (ACUTE ONLY): 30 min  Past Medical History:  Past Medical History:  Diagnosis Date  . Abnormal posture   . Allergic rhinitis   . Anxiety   . Atrial fibrillation (HCC)   . Candidiasis of skin and nail   . Cardiac murmur   . Cataract   . Cerebral palsy (HCC)   . CHF (congestive heart failure) (HCC)   . Chronic pain   . Chronic pain syndrome   . Chronic sinusitis   . Cognitive communication deficit   . Contracture, unspecified ankle   . COPD (chronic obstructive pulmonary disease) (HCC)   . Diabetes mellitus without complication (HCC)   . Dry eye syndrome of bilateral lacrimal glands   . Dysphagia, oropharyngeal phase   . Epilepsy (HCC)   . Essential hypertension   . Functional quadriplegia (HCC)   . GERD (gastroesophageal reflux disease)   . Heart failure (HCC)   . Localized edema   . Major depressive disorder   . Muscle weakness (generalized)   . Nail dystrophy   . Obesity   . Opioid dependence (HCC)   . Osteoporosis   . Presbyopia   . PVD (peripheral vascular disease) (HCC)   . Scoliosis   . Seborrheic dermatitis   . Sedative hypnotic or anxiolytic dependence (HCC)   . Seizures (HCC)   . Sinusitis   . Tinea unguium   . Unspecified intellectual disabilities   . Unspecified psychosis not due to a substance or known physiological condition [facility](HCC)    Past Surgical History: History reviewed. No pertinent surgical history. HPI:  42 year old female with a history of cerebral palsy, functional quadriplegia, seizure disorder, hypertension, anxiety, diastolic CHF, opioid dependence presenting with altered mental status at her nursing facility on the morning of 01/22/2018.  Initial lab work showed WBC 13.7 with urinalysis suggestive of  UTI.  CT of the brain was negative for acute findings.  Patient was started on ceftriaxone after urine cultures were obtained.   Assessment / Plan / Recommendation Clinical Impression  Clinical swallow evaluation completed at bedside. Pt with prolonged oral transit and reduced lingual manipulation with solids. Pt reported preference with softer and chopped textures. Pt without overt signs or symptoms of aspiration with textures and consistencies presented. Will downgrade to D3/mech soft with thin liquids with standard aspiration precautions. SLP Visit Diagnosis: Dysphagia, unspecified (R13.10)    Aspiration Risk  Mild aspiration risk    Diet Recommendation Dysphagia 3 (Mech soft);Thin liquid   Liquid Administration via: Straw Medication Administration: Crushed with puree Supervision: Staff to assist with self feeding;Full supervision/cueing for compensatory strategies Postural Changes: Seated upright at 90 degrees;Remain upright for at least 30 minutes after po intake    Other  Recommendations Oral Care Recommendations: Oral care BID;Staff/trained caregiver to provide oral care Other Recommendations: Clarify dietary restrictions   Follow up Recommendations None      Frequency and Duration            Prognosis Prognosis for Safe Diet Advancement: Good      Swallow Study   General Date of Onset: 01/22/18 HPI: 42 year old female with a history of cerebral palsy, functional quadriplegia, seizure disorder, hypertension, anxiety, diastolic CHF, opioid dependence presenting with altered mental status at her nursing facility on the morning of 01/22/2018.  Initial lab  work showed WBC 13.7 with urinalysis suggestive of UTI.  CT of the brain was negative for acute findings.  Patient was started on ceftriaxone after urine cultures were obtained. Type of Study: Bedside Swallow Evaluation Diet Prior to this Study: Regular;Thin liquids Temperature Spikes Noted: No Respiratory Status: Room  air History of Recent Intubation: No Behavior/Cognition: Alert;Cooperative;Pleasant mood Oral Cavity Assessment: Within Functional Limits Oral Care Completed by SLP: Recent completion by staff Oral Cavity - Dentition: Missing dentition;Poor condition Vision: Impaired for self-feeding Self-Feeding Abilities: Total assist Patient Positioning: Upright in bed Baseline Vocal Quality: Normal Volitional Cough: Weak Volitional Swallow: Able to elicit    Oral/Motor/Sensory Function Overall Oral Motor/Sensory Function: Mild impairment   Ice Chips Ice chips: Within functional limits Presentation: Spoon   Thin Liquid Thin Liquid: Within functional limits Presentation: Straw    Nectar Thick Nectar Thick Liquid: Not tested   Honey Thick Honey Thick Liquid: Not tested   Puree Puree: Within functional limits Presentation: Spoon   Solid   GO   Solid: Impaired Presentation: Spoon Oral Phase Impairments: Reduced lingual movement/coordination;Impaired mastication Oral Phase Functional Implications: Prolonged oral transit       Thank you,  Havery Moros, CCC-SLP [phone]  PORTER,DABNEY 01/24/2018,5:27 PM

## 2018-01-25 DIAGNOSIS — A4151 Sepsis due to Escherichia coli [E. coli]: Principal | ICD-10-CM

## 2018-01-25 LAB — BASIC METABOLIC PANEL
ANION GAP: 7 (ref 5–15)
BUN: 14 mg/dL (ref 6–20)
CHLORIDE: 104 mmol/L (ref 101–111)
CO2: 27 mmol/L (ref 22–32)
Calcium: 9 mg/dL (ref 8.9–10.3)
Creatinine, Ser: 0.52 mg/dL (ref 0.44–1.00)
GFR calc Af Amer: >60 mL/min (ref >60)
GFR calc non Af Amer: >60 mL/min (ref >60)
GLUCOSE: 83 mg/dL (ref 65–99)
POTASSIUM: 4.4 mmol/L (ref 3.5–5.1)
Sodium: 138 mmol/L (ref 135–145)

## 2018-01-25 LAB — URINE CULTURE
Culture: >=100000 — AB
Special Requests: NORMAL

## 2018-01-25 LAB — MAGNESIUM: Magnesium: 2 mg/dL (ref 1.7–2.4)

## 2018-01-25 LAB — PROCALCITONIN: PROCALCITONIN: 6.75 ng/mL

## 2018-01-25 MED ORDER — PRO-STAT SUGAR FREE PO LIQD
30.0000 mL | Freq: Two times a day (BID) | ORAL | Status: DC
  Administered 2018-01-25 – 2018-01-26 (×2): 30 mL via ORAL
  Filled 2018-01-25 (×3): qty 30

## 2018-01-25 MED ORDER — SODIUM CHLORIDE 0.9 % IV SOLN
1.0000 g | Freq: Three times a day (TID) | INTRAVENOUS | Status: DC
  Administered 2018-01-25 – 2018-01-26 (×3): 1 g via INTRAVENOUS
  Filled 2018-01-25 (×10): qty 1

## 2018-01-25 NOTE — Progress Notes (Signed)
PROGRESS NOTE  Hailey Gutierrez ZOX:[id number] DOB: [birth year] DOA: 01/22/2018 PCP: Bernerd Limbo, MD  Brief History: 42 year old female with a history of cerebral palsy, functional quadriplegia, seizure disorder, hypertension, anxiety, diastolic CHF, opioid dependence presenting with altered mental status at her nursing facility on the morning of 01/22/2018. Initial lab work showed WBC 13.7 with urinalysis suggestive of UTI. CT of the brain was negative for acute findings. Patient was started on ceftriaxone after urine cultures were obtained.  Assessment/Plan: Acute metabolic encephalopathy -Secondary to infectious process -Patient now back to baseline -CT brain negative for acute findings  Sepsis -Present at the time of admission -due to UTI -Procalcitonin 21.96 -Continue IV fluids -Lactic acid peaked 1.5 -due to persistent fever, broaden abx coverage due to high risk for MDR organism  UTI -ESBL EColi and Klebsiella -d/c zosyn -start merrem  Aspiration pneumonitis -start zosyn>>>merrem -speech therapy eval--dys 3 with thin liquids  Elevated troponin -No chest pain presently -EKG without concerning ischemic changes -01/23/2018 echo EF 60-65%, grade 2 DD, no WMA -Due to demand ischemia  Dysphagia -speech therapy eval--dys 3 with thin liquids  Seizure disorder -Continue Keppra  Essential hypertension -Continue metoprolol tartrate and lisinopril  Chronic opioid dependence -continue home doses of oxycodone  Depression/anxiety -Continue Lexapro and BuSpar  Functional quadriplegia     Disposition Plan:SNF 6/8 if stable Family Communication:NoFamily at bedside  Consultants:none  Code Status: FULL  DVT Prophylaxis: SCDs   Procedures: As Listed in Progress Note Above  Antibiotics: Ceftriaxone 6/4>>>6/6 Zosyn 6/6>>6/7 merrem 6/7>>>     Subjective: Pt denies cp, sob, abd pain ,headache.  No vomiting  diarrhea or abd pain  Objective: Vitals:   01/24/18 1800 01/24/18 2052 01/25/18 0700 01/25/18 1456  BP:  105/76 102/63 102/73  Pulse: (!) 57 64 64 (!) 50  Resp: 16 20 20 20   Temp: 99.6 F (37.6 C) 98.4 F (36.9 C) 98.3 F (36.8 C) 98.7 F (37.1 C)  TempSrc: Axillary Oral Oral Oral  SpO2: 93% 95% 98% 97%  Weight:      Height:        Intake/Output Summary (Last 24 hours) at 01/25/2018 1742 Last data filed at 01/25/2018 1458 Gross per 24 hour  Intake 993 ml  Output 1300 ml  Net -307 ml   Weight change:  Exam:   General:  Pt is alert, follows commands appropriately, not in acute distress  HEENT: No icterus, No thrush, No neck mass, [place]/AT  Cardiovascular: RRR, S1/S2, no rubs, no gallops  Respiratory: bibasilar rales, no wheeze  Abdomen: Soft/+BS, non tender, non distended, no guarding  Extremities: 1 + LEedema, No lymphangitis, No petechiae, No rashes, no synovitis   Data Reviewed: I have personally reviewed following labs and imaging studies Basic Metabolic Panel: Recent Labs  Lab 01/22/18 1436 01/23/18 0621 01/24/18 0753 01/25/18 0421  NA 142 138 139 138  K 4.0 3.5 3.2* 4.4  CL 108 102 104 104  CO2 21* 27 27 27   GLUCOSE 136* 125* 99 83  BUN 17 16 15 14   CREATININE 0.73 0.70 0.57 0.52  CALCIUM 9.2 8.9 9.0 9.0  MG  --   --   --  2.0   Liver Function Tests: Recent Labs  Lab 01/22/18 1436  AST 22  ALT 12*  ALKPHOS 105  BILITOT 0.7  PROT 8.0  ALBUMIN 3.7   No results for input(s): LIPASE, AMYLASE in the last 168 hours. No results for input(s): AMMONIA  in the last 168 hours. Coagulation Profile: No results for input(s): INR, PROTIME in the last 168 hours. CBC: Recent Labs  Lab 01/22/18 1553 01/23/18 0621 01/24/18 0753  WBC 13.7* 13.5* 7.3  NEUTROABS 11.9*  --   --   HGB 13.4 11.2* 11.4*  HCT 42.3 35.3* 35.5*  MCV 92.2 90.3 89.2  PLT 220 225 194   Cardiac Enzymes: Recent Labs  Lab 01/22/18 1436 01/22/18 1817 01/23/18 0015 01/23/18 0621   TROPONINI 0.13* 0.16* 0.16* 0.17*   BNP: Invalid input(s): POCBNP CBG: Recent Labs  Lab 01/22/18 1425  GLUCAP 124*   HbA1C: No results for input(s): HGBA1C in the last 72 hours. Urine analysis:    Component Value Date/Time   COLORURINE AMBER (A) 01/22/2018 1321   APPEARANCEUR CLOUDY (A) 01/22/2018 1321   LABSPEC 1.016 01/22/2018 1321   PHURINE 5.0 01/22/2018 1321   GLUCOSEU NEGATIVE 01/22/2018 1321   HGBUR LARGE (A) 01/22/2018 1321   BILIRUBINUR NEGATIVE 01/22/2018 1321   KETONESUR NEGATIVE 01/22/2018 1321   PROTEINUR 100 (A) 01/22/2018 1321   NITRITE POSITIVE (A) 01/22/2018 1321   LEUKOCYTESUR SMALL (A) 01/22/2018 1321   Sepsis Labs: @LABRCNTIP (procalcitonin:4,lacticidven:4) ) Recent Results (from the past 240 hour(s))  Urine culture     Status: Abnormal   Collection Time: 01/22/18  4:02 PM  Result Value Ref Range Status   Specimen Description   Final    URINE, CATHETERIZED Performed at [facility], [address] [place], [place] [address]    Special Requests   Final    Normal Performed at [facility], [address] [place], [place] [address]    Culture (A)  Final    >=100,000 COLONIES/mL ESCHERICHIA COLI Confirmed Extended Spectrum Beta-Lactamase Producer (ESBL).  In bloodstream infections from ESBL organisms, carbapenems are preferred over piperacillin/tazobactam. They are shown to have a lower risk of mortality. >=100,000 COLONIES/mL KLEBSIELLA PNEUMONIAE    Report Status 01/25/2018 FINAL  Final   Organism ID, Bacteria ESCHERICHIA COLI (A)  Final   Organism ID, Bacteria KLEBSIELLA PNEUMONIAE (A)  Final      Susceptibility   Escherichia coli - MIC*    AMPICILLIN >=32 RESISTANT Resistant     CEFAZOLIN >=64 RESISTANT Resistant     CEFTRIAXONE >=64 RESISTANT Resistant     CIPROFLOXACIN >=4 RESISTANT Resistant     GENTAMICIN <=1 SENSITIVE Sensitive     IMIPENEM <=0.25 SENSITIVE Sensitive     NITROFURANTOIN <=16 SENSITIVE Sensitive     TRIMETH/SULFA <=20  SENSITIVE Sensitive     AMPICILLIN/SULBACTAM >=32 RESISTANT Resistant     PIP/TAZO 64 INTERMEDIATE Intermediate     Extended ESBL POSITIVE Resistant     * >=100,000 COLONIES/mL ESCHERICHIA COLI   Klebsiella pneumoniae - MIC*    AMPICILLIN >=32 RESISTANT Resistant     CEFAZOLIN 16 SENSITIVE Sensitive     CEFTRIAXONE <=1 SENSITIVE Sensitive     CIPROFLOXACIN <=0.25 SENSITIVE Sensitive     GENTAMICIN <=1 SENSITIVE Sensitive     IMIPENEM <=0.25 SENSITIVE Sensitive     NITROFURANTOIN 64 INTERMEDIATE Intermediate     TRIMETH/SULFA <=20 SENSITIVE Sensitive     AMPICILLIN/SULBACTAM >=32 RESISTANT Resistant     PIP/TAZO 16 SENSITIVE Sensitive     Extended ESBL NEGATIVE Sensitive     * >=100,000 COLONIES/mL KLEBSIELLA PNEUMONIAE  MRSA PCR Screening     Status: None   Collection Time: 01/22/18 10:30 PM  Result Value Ref Range Status   MRSA by PCR NEGATIVE NEGATIVE Final    Comment:  The GeneXpert MRSA Assay (FDA approved for NASAL specimens only), is one component of a comprehensive MRSA colonization surveillance program. It is not intended to diagnose MRSA infection nor to guide or monitor treatment for MRSA infections. Performed at [facility], [address] [place], [place] [address]   Culture, blood (Routine X 2) w Reflex to ID Panel     Status: None (Preliminary result)   Collection Time: 01/23/18  7:18 AM  Result Value Ref Range Status   Specimen Description BLOOD RIGHT HAND  Final   Special Requests   Final    BOTTLES DRAWN AEROBIC ONLY Blood Culture adequate volume   Culture   Final    NO GROWTH 2 DAYS Performed at [facility], [address], [place], [place] [address]    Report Status PENDING  Incomplete  Culture, blood (Routine X 2) w Reflex to ID Panel     Status: None (Preliminary result)   Collection Time: 01/23/18  9:19 PM  Result Value Ref Range Status   Specimen Description BLOOD RIGHT HAND  Final   Special Requests   Final    BOTTLES DRAWN AEROBIC AND  ANAEROBIC Blood Culture adequate volume   Culture   Final    NO GROWTH 2 DAYS Performed at [facility], [address] [place], [place] [address]    Report Status PENDING  Incomplete     Scheduled Meds: . ARIPiprazole  5 mg Oral Daily  . aspirin  81 mg Oral Daily  . baclofen  10 mg Oral TID  . busPIRone  10 mg Oral TID  . calcium carbonate  1 tablet Oral BID  . cholecalciferol  5,000 Units Oral Daily  . docusate sodium  100 mg Oral BID  . escitalopram  10 mg Oral Daily  . feeding supplement (PRO-STAT SUGAR FREE 64)  30 mL Oral BID  . ketoconazole  1 application Topical Once per day on Tue Fri  . levETIRAcetam  500 mg Oral BID  . metoprolol tartrate  25 mg Oral BID  . montelukast  10 mg Oral QHS  . oxyCODONE  5 mg Oral Q8H  . polyethylene glycol  17 g Oral Daily  . sodium chloride flush  3 mL Intravenous Q12H   Continuous Infusions: . sodium chloride    . meropenem (MERREM) IV Stopped (01/25/18 1029)    Procedures/Studies: Dg Chest 1 View  Result Date: 01/22/2018 CLINICAL DATA:  Altered mental status. EXAM: CHEST  1 VIEW COMPARISON:  Radiograph of March 02, 2010. FINDINGS: Stable moderate cardiomegaly. No pneumothorax or pleural effusion is noted. Both lungs are clear. The visualized skeletal structures are unremarkable. IMPRESSION: No acute cardiopulmonary abnormality seen. Electronically Signed   By: Lupita Raider, M.D.   On: 01/22/2018 13:54   Ct Head Wo Contrast  Result Date: 01/22/2018 CLINICAL DATA:  42 year old female with altered mental status this morning. Chronic quadriplegia. EXAM: CT HEAD WITHOUT CONTRAST TECHNIQUE: Contiguous axial images were obtained from the base of the skull through the vertex without intravenous contrast. COMPARISON:  [facility] CT without contrast 03/22/2011. FINDINGS: Brain: Cerebral volume is not significantly changed since 2012. Mildly dysplastic appearing lateral ventricles with mild ex vacuo enlargement, with superimposed  corona radiata and deep white matter capsule encephalomalacia on the left. Stable somewhat disproportionate cerebellar volume loss, more so in the right hemisphere which may relate to Wallerian degeneration. No midline shift, mass effect, evidence of mass lesion, intracranial hemorrhage or evidence of cortically based acute infarction. Vascular: No  suspicious intracranial vascular hyperdensity. Skull: No acute osseous abnormality identified. Sinuses/Orbits: Visualized paranasal sinuses and mastoids are stable and well pneumatized. Other: Disconjugate gaze similar to the prior study. No acute orbit or scalp soft tissue findings. The IMPRESSION: 1.  No acute intracranial abnormality. 2. Overall stable non contrast CT appearance of the brain since 2012. 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 Electronically Signed   By: Odessa Fleming M.D.   On: 01/22/2018 13:58    Catarina Hartshorn, DO  [facility] Pager [phone]  If 7PM-7AM, please contact night-coverage [email or web address] Password [email or web address] 01/25/2018, 5:42 PM   LOS: 1 day

## 2018-01-25 NOTE — Progress Notes (Addendum)
Initial Nutrition Assessment  DOCUMENTATION CODES:  Morbid obesity  INTERVENTION:  Monitor PO intake.  Will order 30 mL Prostat BID, each supplement provides 100 kcal and 15 grams of protein.  NUTRITION DIAGNOSIS:  Increased nutrient needs related to wound healing(pressure wound, stage II) as evidenced by estimated needs.  GOAL:  Patient will meet greater than or equal to 90% of their needs   MONITOR:  PO intake, Supplement acceptance, Weight trends, Skin  REASON FOR ASSESSMENT:  Low Braden   ASSESSMENT:  42 y/o female with an extensive PMHx, including afib, cerebral palsy, CHF, cognitive communication deficit, COPD, DM2, dysphagia, HTN, functional quadriplegia, GERD, edema, obesity, opioid dependence, and unspecified intellectual disabilities.   Spoke to pt, but due to communication difficulties, hx from pt was limited.   Pt stated that appetite now and before admission has been good.  Per notes, it appears was taking a MVI at facility. Pt does not have recorded wt hx, so any potential changes in wt were not able to be determined.  Pt's diet is now dysphagia 3 and her intake has been between 15-50% since admission.  However, given patients bed bound state, short stature, activity factor and obese state, she quite likely does not need to eat 100% of provided meals to meet needs, likely closer to 50-70%.   RD noted food preferences to help promote intake.  These changes were documented in her diet preferences and hopefully she will enjoy/eat more of breakfast tomorrow.    Pt does have stage II pressure ulcer, so Pro-Stat will be ordered to encourage healing.  Labs reviewed: WNL Recent Labs  Lab 01/23/18 0621 01/24/18 0753 01/25/18 0421  NA 138 139 138  K 3.5 3.2* 4.4  CL 102 104 104  CO2 27 27 27   BUN 16 15 14   CREATININE 0.70 0.57 0.52  CALCIUM 8.9 9.0 9.0  MG  --   --  2.0  GLUCOSE 125* 99 83   Medications: Saline, TUMS, vit. D, colace, lopressor, oxycodone,  miralax, merrem   Past Medical History:  Diagnosis Date  . Abnormal posture   . Allergic rhinitis   . Anxiety   . Atrial fibrillation (HCC)   . Candidiasis of skin and nail   . Cardiac murmur   . Cataract   . Cerebral palsy (HCC)   . CHF (congestive heart failure) (HCC)   . Chronic pain   . Chronic pain syndrome   . Chronic sinusitis   . Cognitive communication deficit   . Contracture, unspecified ankle   . COPD (chronic obstructive pulmonary disease) (HCC)   . Diabetes mellitus without complication (HCC)   . Dry eye syndrome of bilateral lacrimal glands   . Dysphagia, oropharyngeal phase   . Epilepsy (HCC)   . Essential hypertension   . Functional quadriplegia (HCC)   . GERD (gastroesophageal reflux disease)   . Heart failure (HCC)   . Localized edema   . Major depressive disorder   . Muscle weakness (generalized)   . Nail dystrophy   . Obesity   . Opioid dependence (HCC)   . Osteoporosis   . Presbyopia   . PVD (peripheral vascular disease) (HCC)   . Scoliosis   . Seborrheic dermatitis   . Sedative hypnotic or anxiolytic dependence (HCC)   . Seizures (HCC)   . Sinusitis   . Tinea unguium   . Unspecified intellectual disabilities   . Unspecified psychosis not due to a substance or known physiological condition (HCC)    NUTRITION -  FOCUSED PHYSICAL EXAM:    Most Recent Value  Orbital Region  No depletion  Temple Region  No depletion  Clavicle Bone Region  No depletion  Patellar Region  -- [did not assess: quadriplega]  Anterior Thigh Region  -- [did not assess: quadriplega]  Posterior Calf Region  -- [did not assess: quadriplega]     Diet Order:   Diet Order           DIET DYS 3 Room service appropriate? Yes; Fluid consistency: Thin  Diet effective now         EDUCATION NEEDS:  No education needs have been identified at this time  Skin:  Skin Assessment: Skin Integrity Issues: Skin Integrity Issues:: Stage II, Other (Comment) Stage II: Right  buttocks: yellow color, no odor, minimal drainage Other: MASD: bilateral arm.  Rash: arm/face; right/left respectively   Last BM:  6/6  Height:  Ht Readings from Last 1 Encounters:  01/22/18 4\' 11"  (1.499 m)   Weight:  Wt Readings from Last 1 Encounters:  01/22/18 240 lb (108.9 kg)   Ideal Body Weight:  40.1 kg  BMI:  Body mass index is 48.47 kg/m.   Ideal Body Weight, Quadriplega Adjusted (-10%): 40.1 kg  Estimated Nutritional Needs:  Kcal:  1200-1400 kcal (30-35 kcal/kg Adjusted IBW) Protein:  60-70 g (20% kcals) Fluid:  1200-1400 mL (721mL/kcal)

## 2018-01-25 NOTE — Evaluation (Addendum)
Physical Therapy Evaluation Patient Details Name: Hailey Gutierrez MRN: [id number] DOB: 1976/07/26 Today's Date: 01/25/2018   History of Present Illness  Hailey Gutierrez is a 42 y.o. female with medical history significant of cerebral palsy, functional quadriplegic, mental retardation, hypertension, seizures, congestive heart failure, comes in with altered mental status this morning.  All history is obtained from her cousin who is present.  Patient is on various antibiotics at her nursing home with various different types of infections.  She comes in this morning because of altered mental status.  She does not require frequent hospitalization.  She has not had any nausea vomiting or diarrhea.  Her cousin says that her mental status is much improved since this morning.  Patient found to have UTI with a fever of 103 and referred for admission for such.  Patient cannot provide any history herself.    Clinical Impression  Patient functioning at baseline, nonambulatory, total assist, hoyer lift transfers.  Plan: Patient discharged from physical therapy to care of nursing for mechanical lift transfers to chair as tolerated for length of stay.     Follow Up Recommendations No PT follow up    Equipment Recommendations  None recommended by PT    Recommendations for Other Services       Precautions / Restrictions Precautions Precautions: Fall Restrictions Weight Bearing Restrictions: No      Mobility  Bed Mobility Overal bed mobility: Needs Assistance Bed Mobility: Rolling Rolling: Total assist;+2 for physical assistance            Transfers                    Ambulation/Gait                Stairs            Wheelchair Mobility    Modified Rankin (Stroke Patients Only)       Balance                                             Pertinent Vitals/Pain Pain Assessment: Faces Faces Pain Scale: Hurts whole lot Pain Location: when  attempting to roll Pain Descriptors / Indicators: Discomfort;Grimacing Pain Intervention(s): Limited activity within patient's tolerance;Monitored during session    Home Living Family/patient expects to be discharged to:: Skilled nursing facility                      Prior Function Level of Independence: Needs assistance   Gait / Transfers Assistance Needed: nonambulator, total assist dependent transfers with hoyar lift  ADL's / Homemaking Assistance Needed: total assist        Hand Dominance        Extremity/Trunk Assessment   Upper Extremity Assessment Upper Extremity Assessment: RUE deficits/detail;LUE deficits/detail RUE Deficits / Details: grossly 1-2/5, deformities wrist/hands LUE Deficits / Details: grossly 1-2/5, deformities wrist/hands    Lower Extremity Assessment Lower Extremity Assessment: RLE deficits/detail;LLE deficits/detail RLE Deficits / Details: 0/5 LLE Deficits / Details: 0/5    Cervical / Trunk Assessment Cervical / Trunk Assessment: Kyphotic  Communication   Communication: No difficulties  Cognition Arousal/Alertness: Awake/alert Behavior During Therapy: WFL for tasks assessed/performed Overall Cognitive Status: Within Functional Limits for tasks assessed  General Comments      Exercises     Assessment/Plan    PT Assessment Patent does not need any further PT services  PT Problem List         PT Treatment Interventions      PT Goals (Current goals can be found in the Care Plan section)  Acute Rehab PT Goals Patient Stated Goal: not stated PT Goal Formulation: With patient Time For Goal Achievement: 01/25/18 Potential to Achieve Goals: Poor    Frequency     Barriers to discharge        Co-evaluation               AM-PAC PT "6 Clicks" Daily Activity  Outcome Measure Difficulty turning over in bed (including adjusting bedclothes, sheets and blankets)?:  Unable Difficulty moving from lying on back to sitting on the side of the bed? : Unable Difficulty sitting down on and standing up from a chair with arms (e.g., wheelchair, bedside commode, etc,.)?: Unable Help needed moving to and from a bed to chair (including a wheelchair)?: Total Help needed walking in hospital room?: Total Help needed climbing 3-5 steps with a railing? : Total 6 Click Score: 6    End of Session   Activity Tolerance: Other (comment)(Patient unable to participate due to severe contractures) Patient left: in bed;with call bell/phone within reach;with bed alarm set Nurse Communication: Mobility status;Other (comment)(RN notified that patient is baseline for hoyar lift transfers) PT Visit Diagnosis: Muscle weakness (generalized) (M62.81);Other symptoms and signs involving the nervous system (R29.898);Difficulty in walking, not elsewhere classified (R26.2)    Time: 1525-1540 PT Time Calculation (min) (ACUTE ONLY): 15 min   Charges:   PT Evaluation $PT Eval Low Complexity: 1 Low PT Treatments $Therapeutic Activity: 8-22 mins   PT G Codes:        4:05 PM, 01/25/18 Ocie BobJames Kinzly Pierrelouis, MPT Physical Therapist with [facility] 336 [phone] office [phone] mobile phone

## 2018-01-25 NOTE — Care Management Important Message (Signed)
Important Message  Patient Details  Name: Hailey Gutierrez MRN: [id number] Date of Birth: 09/15/1975   Medicare Important Message Given:  Yes    Renie OraHawkins, Tasmin Exantus Smith 01/25/2018, 12:26 PM

## 2018-01-25 NOTE — [place] FL2 (Signed)
[place] MEDICAID FL2 LEVEL OF CARE SCREENING TOOL     IDENTIFICATION  Patient Name: Hailey Gutierrez Birthdate: March 13, 1976 Sex: female Admission Date (Current Location): 01/22/2018  [place] Number:  Aaron Edelman [id number] Facility and Address:  [facility],  [address] [place] [address]      Provider Number: [id number]  Attending Physician Name and Address:  Catarina Hartshorn, MD  Relative Name and Phone Number:       Current Level of Care: Hospital Recommended Level of Care: Skilled Nursing Facility Prior Approval Number:    Date Approved/Denied:   PASRR Number:    Discharge Plan: SNF    Current Diagnoses: Patient Active Problem List   Diagnosis Date Noted  . Sepsis due to undetermined organism (HCC) 01/23/2018  . UTI (urinary tract infection) 01/22/2018  . Elevated troponin 01/22/2018  . Acute metabolic encephalopathy 01/22/2018  . Unspecified intellectual disabilities   . PVD (peripheral vascular disease) (HCC)   . Essential hypertension   . Functional quadriplegia (HCC)   . Seizures (HCC)   . COPD (chronic obstructive pulmonary disease) (HCC)   . CHF (congestive heart failure) (HCC)   . Cerebral palsy (HCC)     Orientation RESPIRATION BLADDER Height & Weight     Self, Time, Situation, Place  Normal Incontinent Weight: 240 lb (108.9 kg) Height:  4\' 11"  (149.9 cm)  BEHAVIORAL SYMPTOMS/MOOD NEUROLOGICAL BOWEL NUTRITION STATUS      Incontinent (DYS 3)  AMBULATORY STATUS COMMUNICATION OF NEEDS Skin   Total Care(uses geri chair) Verbally Normal(Stage II buttocks right)                       Personal Care Assistance Level of Assistance  Total care       Total Care Assistance: Maximum assistance   Functional Limitations Info  Sight, Hearing, Speech Sight Info: Adequate Hearing Info: Adequate Speech Info: Adequate    SPECIAL CARE FACTORS FREQUENCY        PT Frequency: n/a              Contractures Contractures  Info: Not present    Additional Factors Info  Code Status, Allergies, Psychotropic Code Status Info: DNR Allergies Info: NKA Psychotropic Info: Abilify, Lexapro         Current Medications (01/25/2018):  This is the current hospital active medication list Current Facility-Administered Medications  Medication Dose Route Frequency Provider Last Rate Last Dose  . 0.9 %  sodium chloride infusion  250 mL Intravenous PRN Haydee Monica, MD      . acetaminophen (TYLENOL) tablet 650 mg  650 mg Oral Q4H PRN Haydee Monica, MD   650 mg at 01/23/18 1630  . ARIPiprazole (ABILIFY) tablet 5 mg  5 mg Oral Daily Tarry Kos A, MD   5 mg at 01/25/18 1000  . aspirin chewable tablet 81 mg  81 mg Oral Daily Tarry Kos A, MD   81 mg at 01/25/18 1003  . baclofen (LIORESAL) tablet 10 mg  10 mg Oral TID Haydee Monica, MD   10 mg at 01/25/18 1520  . busPIRone (BUSPAR) tablet 10 mg  10 mg Oral TID Haydee Monica, MD   10 mg at 01/25/18 1520  . calcium carbonate (TUMS - dosed in mg elemental calcium) chewable tablet 200 mg of elemental calcium  1 tablet Oral BID Tarry Kos A, MD   200 mg of elemental calcium at 01/25/18 1000  . cholecalciferol (VITAMIN D) tablet  5,000 Units  5,000 Units Oral Daily Haydee Monicaavid, Rachal A, MD   5,000 Units at 01/25/18 0959  . docusate sodium (COLACE) capsule 100 mg  100 mg Oral BID Tarry Kosavid, Rachal A, MD   100 mg at 01/25/18 1003  . escitalopram (LEXAPRO) tablet 10 mg  10 mg Oral Daily Tarry Kosavid, Rachal A, MD   10 mg at 01/25/18 0959  . feeding supplement (PRO-STAT SUGAR FREE 64) liquid 30 mL  30 mL Oral BID Tat, David, MD   30 mL at 01/25/18 1528  . ipratropium-albuterol (DUONEB) 0.5-2.5 (3) MG/3ML nebulizer solution 3 mL  3 mL Nebulization Q4H PRN Haydee Monicaavid, Rachal A, MD      . ketoconazole (NIZORAL) 2 % shampoo 1 application  1 application Topical Once per day on Tue Fri Haydee Monicaavid, Rachal A, MD   1 application at 01/25/18 1044  . levETIRAcetam (KEPPRA) 100 MG/ML solution 500 mg  500 mg Oral  BID Tarry Kosavid, Rachal A, MD   500 mg at 01/25/18 1004  . meropenem (MERREM) 1 g in sodium chloride 0.9 % 100 mL IVPB  1 g Intravenous Andres LabrumQ8H Tat, David, MD   Stopped at 01/25/18 1029  . metoprolol tartrate (LOPRESSOR) tablet 25 mg  25 mg Oral BID Tarry Kosavid, Rachal A, MD   25 mg at 01/25/18 1000  . montelukast (SINGULAIR) tablet 10 mg  10 mg Oral QHS Tarry Kosavid, Rachal A, MD   10 mg at 01/24/18 2112  . oxyCODONE (Oxy IR/ROXICODONE) immediate release tablet 5 mg  5 mg Oral Q8H Tarry Kosavid, Rachal A, MD   5 mg at 01/25/18 1443  . oxyCODONE-acetaminophen (PERCOCET/ROXICET) 5-325 MG per tablet 1 tablet  1 tablet Oral Q4H PRN Tarry Kosavid, Rachal A, MD      . polyethylene glycol (MIRALAX / GLYCOLAX) packet 17 g  17 g Oral Daily Tarry Kosavid, Rachal A, MD   17 g at 01/25/18 1000  . sodium chloride (OCEAN) 0.65 % nasal spray 1 spray  1 spray Each Nare PRN Tarry Kosavid, Rachal A, MD      . sodium chloride flush (NS) 0.9 % injection 3 mL  3 mL Intravenous Q12H Tarry Kosavid, Rachal A, MD   3 mL at 01/25/18 1019  . sodium chloride flush (NS) 0.9 % injection 3 mL  3 mL Intravenous PRN Haydee Monicaavid, Rachal A, MD         Discharge Medications: Please see discharge summary for a list of discharge medications.  Relevant Imaging Results:  Relevant Lab Results:   Additional Information    Dorthy Magnussen, Juleen ChinaHeather D, LCSW

## 2018-01-25 NOTE — Clinical Social Work Note (Signed)
Clinical Social Work Assessment  Patient Details  Name: Hailey Gutierrez MRN: [id number] Date of Birth: 12/19/1975  Date of referral:  01/25/18               Reason for consult:  Facility Placement                Permission sought to share information with:    Permission granted to share information::     Name::        Agency::  Garth Schlatterarolyn Lee, social worker, [facility]  Relationship::     Contact Information:     Housing/Transportation Living arrangements for the past 2 months:  [facility] Facility Source of Information:  Facility Patient Interpreter Needed:  None Criminal Activity/Legal Involvement Pertinent to Current Situation/Hospitalization:  No - Comment as needed Significant Relationships:  Siblings Lives with:  Facility Resident Do you feel safe going back to the place where you live?  Yes Need for family participation in patient care:  Yes (Comment)  Care giving concerns:  Patient requires total care.    Social Worker assessment / plan:  Patient has been at the facility for about 2.5 years, she is a total care resident. She has a supportive sister.   Employment status:  Disabled (Comment on whether or not currently receiving Disability) Insurance information:  Teacher, English as a foreign languageManaged Medicare, [place] Recommendations:    Information / Referral to community resources:     Patient/Family's Response to care:  Patient is a long term resident at the facility.   Patient/Family's Understanding of and Emotional Response to Diagnosis, Current Treatment, and Prognosis:  Patient understands her diagnosis, treatment and prognosis as does her sister and understand that SNF is the appropriate level of care.   Emotional Assessment Appearance:  Appears stated age Attitude/Demeanor/Rapport:    Affect (typically observed):  Unable to Assess Orientation:  Oriented to Situation, Oriented to  Time, Oriented to Place, Oriented to Self Alcohol / Substance use:  Not Applicable Psych  involvement (Current and /or in the community):  No (Comment)  Discharge Needs  Concerns to be addressed:  No discharge needs identified Readmission within the last 30 days:  No Current discharge risk:  None Barriers to Discharge:  No Barriers Identified   Annice NeedySettle, Clayton Bosserman D, LCSW 01/25/2018, 4:18 PM

## 2018-01-26 MED ORDER — SULFAMETHOXAZOLE-TRIMETHOPRIM 800-160 MG PO TABS
1.0000 | ORAL_TABLET | Freq: Two times a day (BID) | ORAL | Status: DC
  Administered 2018-01-26: 1 via ORAL
  Filled 2018-01-26: qty 1

## 2018-01-26 MED ORDER — PRO-STAT SUGAR FREE PO LIQD: 30.0000 mL | Freq: Two times a day (BID) | ORAL | 0 refills | Status: AC

## 2018-01-26 MED ORDER — SULFAMETHOXAZOLE-TRIMETHOPRIM 800-160 MG PO TABS: 1.0000 | ORAL_TABLET | Freq: Two times a day (BID) | ORAL | 0 refills | Status: AC

## 2018-01-26 MED ORDER — AMOXICILLIN-POT CLAVULANATE 875-125 MG PO TABS: 1.0000 | ORAL_TABLET | Freq: Two times a day (BID) | ORAL | 0 refills | Status: AC

## 2018-01-26 NOTE — Discharge Summary (Signed)
Physician Discharge Summary  Hailey Gutierrez BJY:[id number] DOB: 06/05/1976 DOA: 01/22/2018  PCP: Bernerd Limbo, MD  Admit date: 01/22/2018 Discharge date: 01/26/2018  Admitted From: SNF--[facility] Disposition:  SNF  Recommendations for Outpatient Follow-up:  1. Follow up with PCP in 1-2 weeks 2. Please obtain BMP/CBC in one week     Discharge Condition: Stable CODE STATUS:DNR Diet recommendation: Dysphagia 3 with thin liquids   Brief/Interim Summary: 42 year old female with a history of cerebral palsy, functional quadriplegia, seizure disorder, hypertension, anxiety, diastolic CHF, opioid dependence presenting with altered mental status at her nursing facility on the morning of 01/22/2018. Initial lab work showed WBC 13.7 with urinalysis suggestive of UTI. CT of the brain was negative for acute findings. Patient was started on ceftriaxone after urine cultures were obtained.  The patient was started on IV fluids.  In addition, there was concern the patient may have had aspiration pneumonitis.  She had difficulty swallowing liquids frequently coughing when she drank fluids.  She was seen by speech therapy whom recommended a dysphagia 3 diet with thin liquids.  The patient's antibiotic coverage was initially broadened to Zosyn.  Once her culture data revealed ESBL E. coli, the patient was switched to Merrem.  She will be discharged back to her nursing facility with 6 more days of trimethoprim sulfamethoxazole for her UTI and 3 more days of amox/clav for aspiration pneumonitis.  The patient's mental status improved and returned to baseline.  The patient was communicative although dysarthric at baseline.    Discharge Diagnoses:  Acute metabolic encephalopathy -Secondary to infectious process -Patientnow back to baseline -CT brain negative for acute findings  Sepsis -Present at the time of admission -due to UTI -Procalcitonin 21.96 -Continue IV fluids -Lactic acid  peaked 1.5 -due to persistent fever, broaden abx coverage due to high risk for MDR organism -Ultimately, the patient's fever resolved and sepsis physiology improved.  UTI -ESBL EColi and Klebsiella -d/c zosyn -start merrem -d/c with TMP/SMZ DS x 6 more days to complete 1 week of tx  Aspiration pneumonitis -start zosyn>>>merrem -speech therapy eval--dys 3 with thin liquids -d/c with amox/clav x 3 more days  Elevated troponin -No chest pain presently -EKG without concerning ischemic changes -01/23/2018 echo EF 60-65%, grade 2 DD, no WMA -Due to demand ischemia  Dysphagia -speech therapy eval--dys 3 with thin liquids  Seizure disorder -Continue Keppra  Essential hypertension -Continue metoprolol tartrate  -Lisinopril was discontinued secondary to soft blood pressures.  Her blood pressures remain well controlled.  She will not be restarted on lisinopril.  Chronic opioid dependence -continue home doses of oxycodone  Depression/anxiety -Continue Lexapro and BuSpar  Functional quadriplegia       Discharge Instructions   Allergies as of 01/26/2018   No Known Allergies     Medication List    STOP taking these medications   fexofenadine 60 MG tablet Commonly known as:  ALLEGRA   lisinopril 2.5 MG tablet Commonly known as:  PRINIVIL,ZESTRIL     TAKE these medications   ABILIFY 5 MG tablet Generic drug:  ARIPiprazole Take 5 mg by mouth daily.   acetaminophen 325 MG tablet Commonly known as:  TYLENOL Take 650 mg by mouth every 4 (four) hours as needed for fever (101 or higher).   amoxicillin-clavulanate 875-125 MG tablet Commonly known as:  AUGMENTIN Take 1 tablet by mouth 2 (two) times daily. X 3 days   aspirin 81 MG chewable tablet Chew 81 mg by mouth daily.   baclofen 10 MG  tablet Commonly known as:  LIORESAL Take 10 mg by mouth 3 (three) times daily.   busPIRone 10 MG tablet Commonly known as:  BUSPAR Take 10 mg by mouth 3 (three) times  daily.   calcium carbonate 500 MG chewable tablet Commonly known as:  TUMS - dosed in mg elemental calcium Chew 1 tablet by mouth 2 (two) times daily.   Cholecalciferol 5000 units capsule Take 5,000 Units by mouth daily.   docusate sodium 100 MG capsule Commonly known as:  COLACE Take 100 mg by mouth 2 (two) times daily.   escitalopram 10 MG tablet Commonly known as:  LEXAPRO Take 10 mg by mouth daily.   feeding supplement (PRO-STAT SUGAR FREE 64) Liqd Take 30 mLs by mouth 2 (two) times daily.   fluocinonide 0.05 % external solution Commonly known as:  LIDEX Apply 1 application topically daily as needed (itching in both ears).   fluticasone 50 MCG/ACT nasal spray Commonly known as:  FLONASE Place into both nostrils daily.   furosemide 20 MG tablet Commonly known as:  LASIX Take 20 mg by mouth.   ipratropium-albuterol 0.5-2.5 (3) MG/3ML Soln Commonly known as:  DUONEB Take 3 mLs by nebulization every 4 (four) hours as needed (SOB or Wheezing).   ketoconazole 2 % shampoo Commonly known as:  NIZORAL Apply 1 application topically 2 (two) times a week. In the afternoon every Tuesday and friday   levETIRAcetam 100 MG/ML solution Commonly known as:  KEPPRA Take 500 mg by mouth 2 (two) times daily.   medroxyPROGESTERone 150 MG/ML injection Commonly known as:  DEPO-PROVERA Inject 150 mg into the muscle every 3 (three) months.   metoprolol tartrate 25 MG tablet Commonly known as:  LOPRESSOR Take 1 tablet (25 mg total) by mouth 2 (two) times daily.   montelukast 10 MG tablet Commonly known as:  SINGULAIR Take 10 mg by mouth at bedtime.   nystatin cream Commonly known as:  MYCOSTATIN Apply 1 application topically 2 (two) times daily. Apply to left cheek (face) until resolved   nystatin powder Commonly known as:  MYCOSTATIN/NYSTOP Apply 1 g topically 2 (two) times daily.   oxyCODONE 5 MG immediate release tablet Commonly known as:  Oxy IR/ROXICODONE Take 5 mg by  mouth 3 (three) times daily.   oxyCODONE-acetaminophen 5-325 MG tablet Commonly known as:  PERCOCET/ROXICET Take 1 tablet by mouth every 4 (four) hours as needed for severe pain.   polyethylene glycol packet Commonly known as:  MIRALAX / GLYCOLAX Take 17 g by mouth daily.   ranitidine 75 MG tablet Commonly known as:  ZANTAC Take 75 mg by mouth 2 (two) times daily.   sodium chloride 0.65 % Soln nasal spray Commonly known as:  OCEAN Place 1 spray into both nostrils as needed (nasal dryness).   sulfamethoxazole-trimethoprim 800-160 MG tablet Commonly known as:  BACTRIM DS,SEPTRA DS Take 1 tablet by mouth every 12 (twelve) hours. X 6 days       No Known Allergies  Consultations:  none   Procedures/Studies: Dg Chest 1 View  Result Date: 01/22/2018 CLINICAL DATA:  Altered mental status. EXAM: CHEST  1 VIEW COMPARISON:  Radiograph of March 02, 2010. FINDINGS: Stable moderate cardiomegaly. No pneumothorax or pleural effusion is noted. Both lungs are clear. The visualized skeletal structures are unremarkable. IMPRESSION: No acute cardiopulmonary abnormality seen. Electronically Signed   By: Lupita Raider, M.D.   On: 01/22/2018 13:54   Ct Head Wo Contrast  Result Date: 01/22/2018 CLINICAL DATA:  42 year old female  with altered mental status this morning. Chronic quadriplegia. EXAM: CT HEAD WITHOUT CONTRAST TECHNIQUE: Contiguous axial images were obtained from the base of the skull through the vertex without intravenous contrast. COMPARISON:  [facility] Head CT without contrast 03/22/2011. FINDINGS: Brain: Cerebral volume is not significantly changed since 2012. Mildly dysplastic appearing lateral ventricles with mild ex vacuo enlargement, with superimposed corona radiata and deep white matter capsule encephalomalacia on the left. Stable somewhat disproportionate cerebellar volume loss, more so in the right hemisphere which may relate to Wallerian degeneration. No midline shift,  mass effect, evidence of mass lesion, intracranial hemorrhage or evidence of cortically based acute infarction. Vascular: No suspicious intracranial vascular hyperdensity. Skull: No acute osseous abnormality identified. Sinuses/Orbits: Visualized paranasal sinuses and mastoids are stable and well pneumatized. Other: Disconjugate gaze similar to the prior study. No acute orbit or scalp soft tissue findings. The IMPRESSION: 1.  No acute intracranial abnormality. 2. Overall stable non contrast CT appearance of the brain since 2012. 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 Electronically Signed   By: Odessa FlemingH  Hall M.D.   On: 01/22/2018 13:58        Discharge Exam: Vitals:   01/25/18 2221 01/26/18 0541  BP: (!) 125/48 119/85  Pulse: 81 (!) 56  Resp: (!) 24 12  Temp: 98.2 F (36.8 C) 97.6 F (36.4 C)  SpO2: 97% 100%   Vitals:   01/25/18 0700 01/25/18 1456 01/25/18 2221 01/26/18 0541  BP: 102/63 102/73 (!) 125/48 119/85  Pulse: 64 (!) 50 81 (!) 56  Resp: 20 20 (!) 24 12  Temp: 98.3 F (36.8 C) 98.7 F (37.1 C) 98.2 F (36.8 C) 97.6 F (36.4 C)  TempSrc: Oral Oral Oral Oral  SpO2: 98% 97% 97% 100%  Weight:      Height:        General: Pt is alert, awake, not in acute distress Cardiovascular: RRR, S1/S2 +, no rubs, no gallops Respiratory: CTA bilaterally, no wheezing, no rhonchi Abdominal: Soft, NT, ND, bowel sounds + Extremities: no edema, no cyanosis   The results of significant diagnostics from this hospitalization (including imaging, microbiology, ancillary and laboratory) are listed below for reference.    Significant Diagnostic Studies: Dg Chest 1 View  Result Date: 01/22/2018 CLINICAL DATA:  Altered mental status. EXAM: CHEST  1 VIEW COMPARISON:  Radiograph  of March 02, 2010. FINDINGS: Stable moderate cardiomegaly. No pneumothorax or pleural effusion is noted. Both lungs are clear. The visualized skeletal structures are unremarkable. IMPRESSION: No acute cardiopulmonary abnormality seen. Electronically Signed   By: Lupita RaiderJames  Green Jr, M.D.   On: 01/22/2018 13:54   Ct Head Wo Contrast  Result Date: 01/22/2018 CLINICAL DATA:  42 year old female with altered mental status this morning. Chronic quadriplegia. EXAM: CT HEAD WITHOUT CONTRAST TECHNIQUE: Contiguous axial images were obtained from the base of the skull through the vertex without intravenous contrast. [facility]PAR[facility]:  [facility] Head CT without contrast 03/22/2011. FINDINGS: Brain: Cerebral volume is not significantly changed since 2012. Mildly dysplastic appearing lateral ventricles with mild ex vacuo enlargement, with superimposed corona radiata and deep white matter capsule encephalomalacia on the left. Stable somewhat disproportionate cerebellar volume loss, more so in the right hemisphere which may relate to Wallerian degeneration. No midline shift, mass effect, evidence of mass lesion, intracranial hemorrhage or evidence of cortically based acute infarction. Vascular: No suspicious intracranial vascular hyperdensity.  Skull: No acute osseous abnormality identified. Sinuses/Orbits: Visualized paranasal sinuses and mastoids are stable and well pneumatized. Other: Disconjugate gaze similar to the prior study. No acute orbit or scalp soft tissue findings. The IMPRESSION: 1.  No acute intracranial abnormality. 2. Overall stable non contrast CT appearance of the brain since 2012. 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 Electronically Signed   By: Odessa Fleming M.D.   On: 01/22/2018 13:58      Microbiology: Recent Results (from the past 240 hour(s))  Urine culture     Status: Abnormal   Collection Time: 01/22/18  4:02 PM  Result Value Ref Range Status   Specimen Description   Final    URINE, CATHETERIZED Performed at [facility], [address], [place], [place] [address]    Special Requests   Final    Normal Performed at [facility], [address], [place], [place] [address]    Culture (A)  Final    >=100,000 COLONIES/mL ESCHERICHIA COLI Confirmed Extended Spectrum Beta-Lactamase Producer (ESBL).  In bloodstream infections from ESBL organisms, carbapenems are preferred over piperacillin/tazobactam. They are shown to have a lower risk of mortality. >=100,000 COLONIES/mL KLEBSIELLA PNEUMONIAE    Report Status 01/25/2018 FINAL  Final   Organism ID, Bacteria ESCHERICHIA COLI (A)  Final   Organism ID, Bacteria KLEBSIELLA PNEUMONIAE (A)  Final      Susceptibility   Escherichia coli - MIC*    AMPICILLIN >=32 RESISTANT Resistant     CEFAZOLIN >=64 RESISTANT Resistant     CEFTRIAXONE >=64 RESISTANT Resistant     CIPROFLOXACIN >=4 RESISTANT Resistant     GENTAMICIN <=1 SENSITIVE Sensitive     IMIPENEM <=0.25 SENSITIVE Sensitive     NITROFURANTOIN <=16 SENSITIVE Sensitive     TRIMETH/SULFA <=20 SENSITIVE Sensitive     AMPICILLIN/SULBACTAM >=32 RESISTANT Resistant     PIP/TAZO 64 INTERMEDIATE Intermediate     Extended ESBL POSITIVE Resistant     * >=100,000 COLONIES/mL ESCHERICHIA COLI   Klebsiella pneumoniae - MIC*    AMPICILLIN >=32 RESISTANT Resistant     CEFAZOLIN 16 SENSITIVE Sensitive     CEFTRIAXONE <=1 SENSITIVE Sensitive     CIPROFLOXACIN <=0.25 SENSITIVE Sensitive     GENTAMICIN <=1 SENSITIVE Sensitive     IMIPENEM <=0.25 SENSITIVE Sensitive     NITROFURANTOIN 64 INTERMEDIATE Intermediate     TRIMETH/SULFA <=20 SENSITIVE Sensitive     AMPICILLIN/SULBACTAM >=32 RESISTANT Resistant     PIP/TAZO 16 SENSITIVE Sensitive     Extended ESBL NEGATIVE Sensitive      * >=100,000 COLONIES/mL KLEBSIELLA PNEUMONIAE  MRSA PCR Screening     Status: None   Collection Time: 01/22/18 10:30 PM  Result Value Ref Range Status   MRSA by PCR NEGATIVE NEGATIVE Final    Comment:        The GeneXpert MRSA Assay (FDA approved for NASAL specimens only), is one component of a comprehensive MRSA colonization surveillance program. It is not intended to diagnose MRSA infection nor to guide or monitor treatment for MRSA infections. Performed at [facility], [address] [place], [place] [address]   Culture, blood (Routine X 2) w Reflex to ID Panel     Status: None (Preliminary result)   Collection Time: 01/23/18  7:18 AM  Result Value Ref Range Status   Specimen Description BLOOD RIGHT HAND  Final  Special Requests   Final    BOTTLES DRAWN AEROBIC ONLY Blood Culture adequate volume   Culture   Final    NO GROWTH 2 DAYS Performed at [facility], [address] [place], [place] [address]    Report Status PENDING  Incomplete  Culture, blood (Routine X 2) w Reflex to ID Panel     Status: None (Preliminary result)   Collection Time: 01/23/18  9:19 PM  Result Value Ref Range Status   Specimen Description BLOOD RIGHT HAND  Final   Special Requests   Final    BOTTLES DRAWN AEROBIC AND ANAEROBIC Blood Culture adequate volume   Culture   Final    NO GROWTH 2 DAYS Performed at [facility], [address] [place], [place] [address]    Report Status PENDING  Incomplete     Labs: Basic Metabolic Panel: Recent Labs  Lab 01/22/18 1436 01/23/18 0621 01/24/18 0753 01/25/18 0421  NA 142 138 139 138  K 4.0 3.5 3.2* 4.4  CL 108 102 104 104  CO2 21* 27 27 27   GLUCOSE 136* 125* 99 83  BUN 17 16 15 14   CREATININE 0.73 0.70 0.57 0.52  CALCIUM 9.2 8.9 9.0 9.0  MG  --   --   --  2.0   Liver Function Tests: Recent Labs  Lab 01/22/18 1436  AST 22  ALT 12*  ALKPHOS 105  BILITOT 0.7  PROT 8.0  ALBUMIN 3.7   No results for input(s): LIPASE, AMYLASE in  the last 168 hours. No results for input(s): AMMONIA in the last 168 hours. CBC: Recent Labs  Lab 01/22/18 1553 01/23/18 0621 01/24/18 0753  WBC 13.7* 13.5* 7.3  NEUTROABS 11.9*  --   --   HGB 13.4 11.2* 11.4*  HCT 42.3 35.3* 35.5*  MCV 92.2 90.3 89.2  PLT 220 225 194   Cardiac Enzymes: Recent Labs  Lab 01/22/18 1436 01/22/18 1817 01/23/18 0015 01/23/18 0621  TROPONINI 0.13* 0.16* 0.16* 0.17*   BNP: Invalid input(s): POCBNP CBG: Recent Labs  Lab 01/22/18 1425  GLUCAP 124*    Time coordinating discharge:  36 minutes  Signed:  Catarina Hartshorn, DO [facility] Pager: [phone] 01/26/2018, 9:21 AM

## 2018-01-26 NOTE — Progress Notes (Signed)
Patient is to be [facility] and in stable condition. Patient's IV and telemetry removed, WNL. Patient and legal guardian made aware of discharge. [facility], LPN and transportation via ambulance arranged.  Quita SkyeMorgan P Dishmon, RN

## 2018-01-26 NOTE — Progress Notes (Signed)
[facility] Anticipated DC Date:01/26/18 Family Notified:Eddie Valentina LucksStout [phone] (hubsand of Alean RinneSherry Stout) Transport JX:[id number]:[facility]   Per MD patient ready for [facility] . RN, patient, patient's family, and facility notified of DC. Assessment, Fl2/Pasrr, and Discharge Summary sent to facility. RN given number for report [phone] ask for Diane). DC packet on chart. Ambulance transport requested for patient.   CSW signing off.  Budd Palmerara Mikailah Morel LCSWA [phone]

## 2018-01-28 LAB — CULTURE, BLOOD (ROUTINE X 2)
CULTURE: NO GROWTH
Culture: NO GROWTH
Special Requests: ADEQUATE
Special Requests: ADEQUATE

## 2018-04-19 ENCOUNTER — Institutional Professional Consult (permissible substitution) (INDEPENDENT_AMBULATORY_CARE_PROVIDER_SITE_OTHER): Payer: Self-pay | Admitting: Orthopaedic Surgery

## 2018-04-23 ENCOUNTER — Encounter (INDEPENDENT_AMBULATORY_CARE_PROVIDER_SITE_OTHER): Payer: Self-pay | Admitting: Orthopaedic Surgery

## 2018-04-23 ENCOUNTER — Ambulatory Visit (INDEPENDENT_AMBULATORY_CARE_PROVIDER_SITE_OTHER): Payer: Medicare Other | Admitting: Orthopaedic Surgery

## 2018-04-23 ENCOUNTER — Ambulatory Visit (INDEPENDENT_AMBULATORY_CARE_PROVIDER_SITE_OTHER): Payer: Self-pay

## 2018-04-23 DIAGNOSIS — M25512 Pain in left shoulder: Secondary | ICD-10-CM

## 2018-04-23 NOTE — Progress Notes (Signed)
rescheduled

## 2018-07-04 IMAGING — CT CT HEAD W/O CM
4 series · 16 of 47 positions shown, 18 images · non-contrast
Comparison: Lorraine Jim CT without contrast 03/22/2011.

CLINICAL DATA: 42-year-old female with altered mental status this
morning. Chronic quadriplegia.

EXAM:
CT HEAD WITHOUT CONTRAST
TECHNIQUE: Contiguous axial images were obtained from the base of the skull
through the vertex without intravenous contrast.

[Series 2: head trauma wo · axial · 0.40mm/px · z∈[+1658,+1773]mm · 7 of 31 slices shown, 9 images]
[im 4/31  brain]
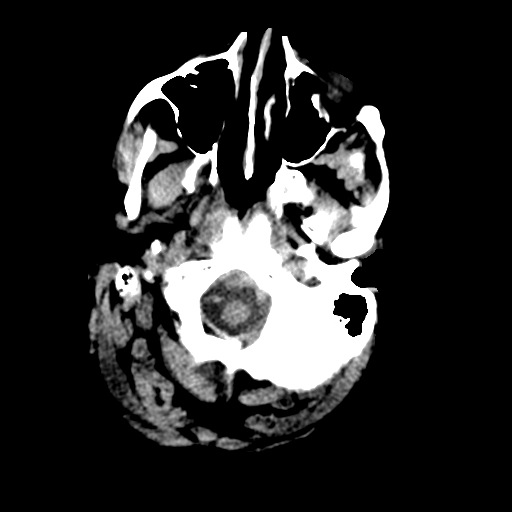
[im 4/31  bone]
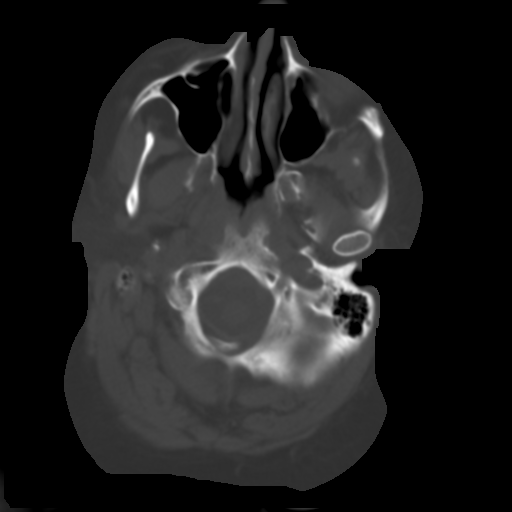
[im 8/31  brain]
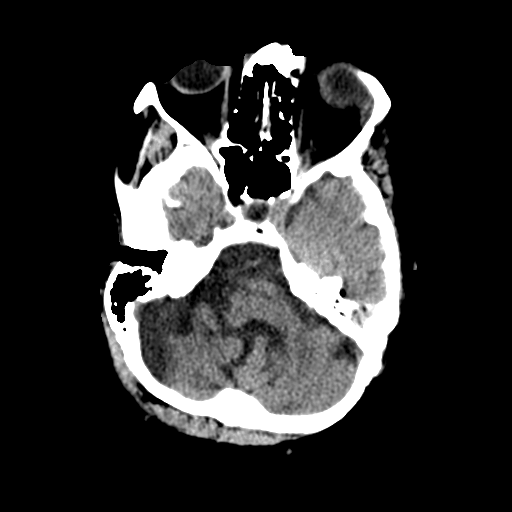
[im 12/31  brain]
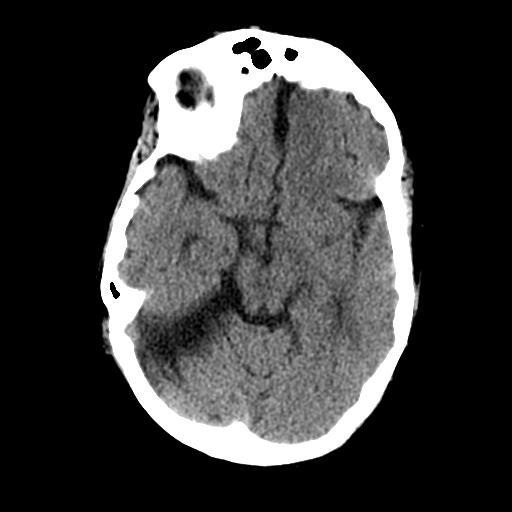
[im 16/31  brain]
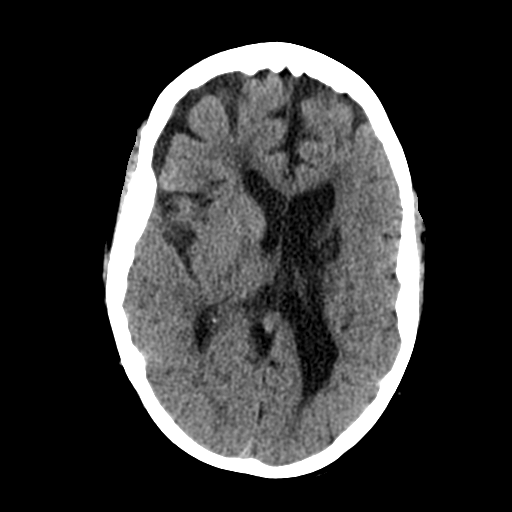
[im 19/31  brain]
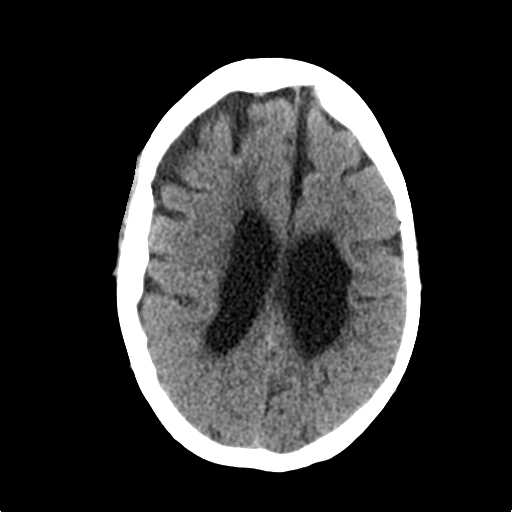
[im 19/31  bone]
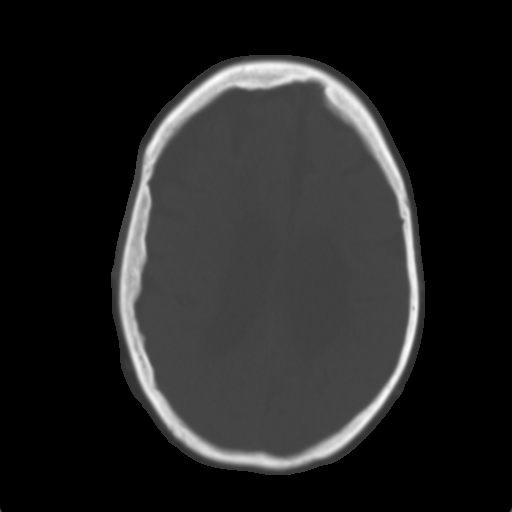
[im 23/31  brain]
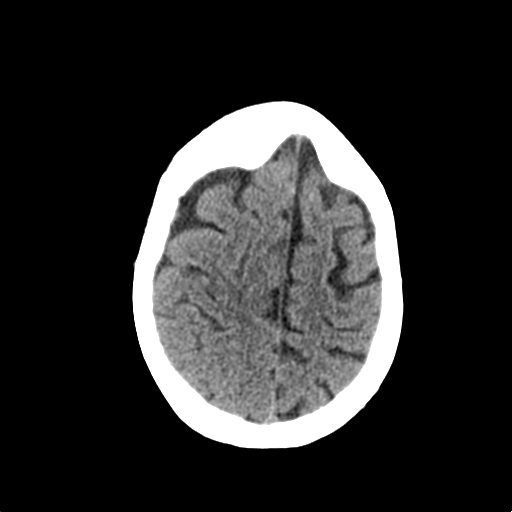
[im 27/31  brain]
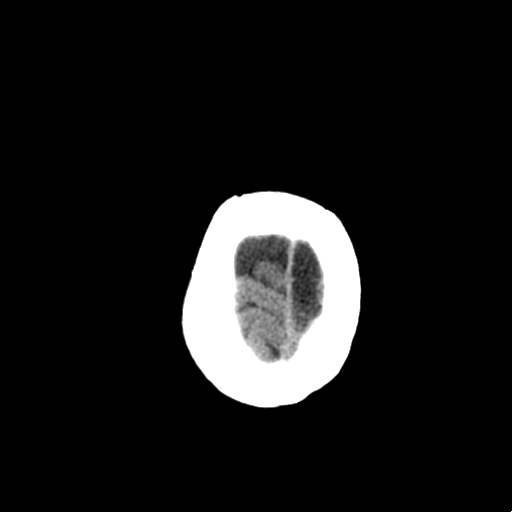

[Series 3: head bone · axial · 0.40mm/px · z∈[+1657,+1687]mm · 3 of 77 slices shown]
[im 8/77  bone]
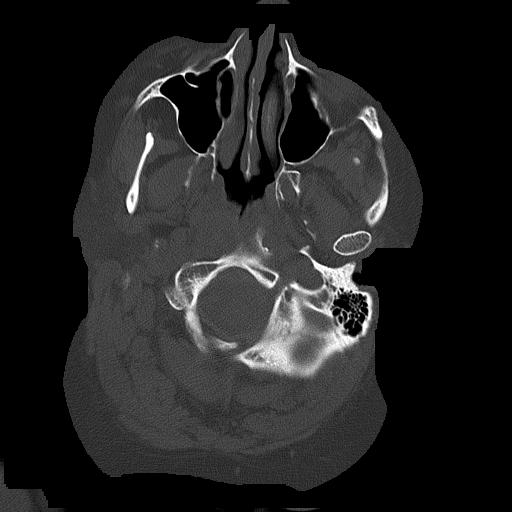
[im 16/77  bone]
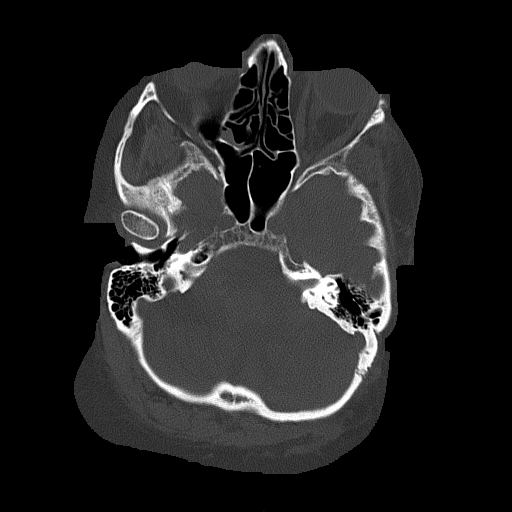
[im 23/77  bone]
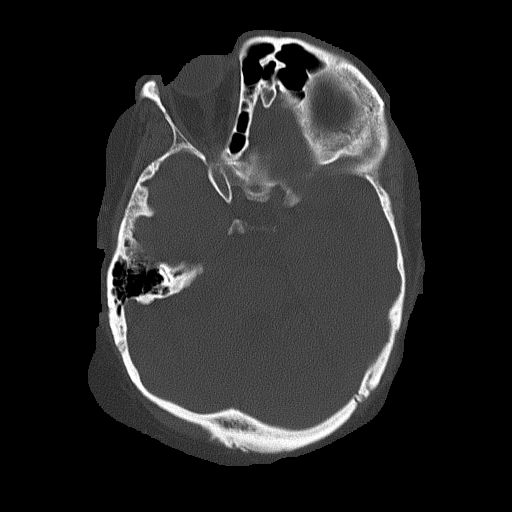

[Series 4: coronal soft tissue · coronal · 0.32mm/px · 3 of 71 slices shown]
[im 24/71  brain]
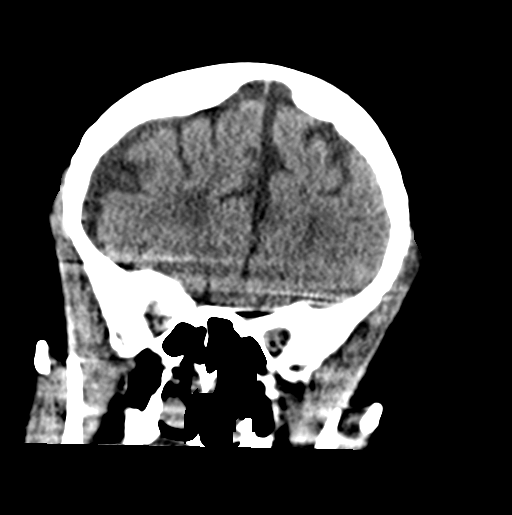
[im 32/71  brain]
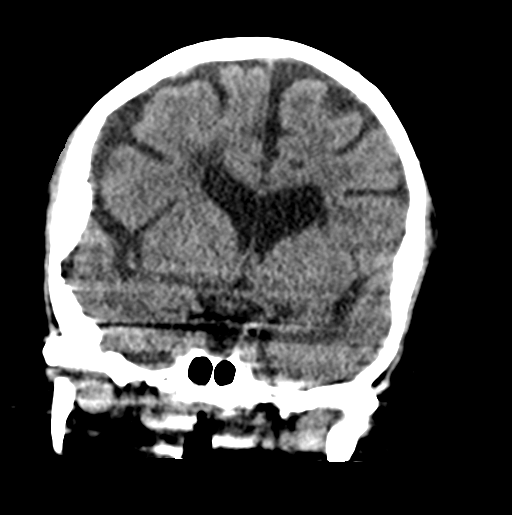
[im 39/71  brain]
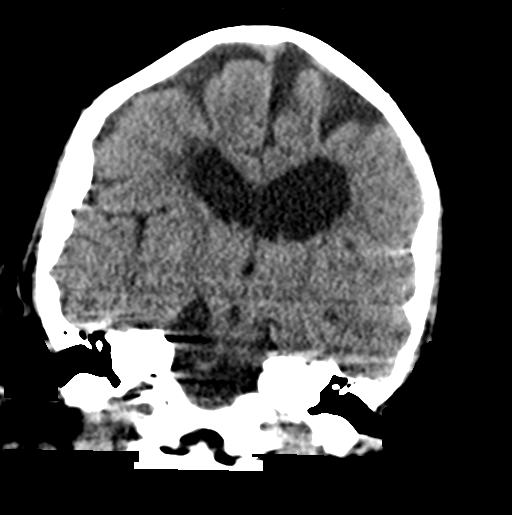

[Series 5: sagittal soft tissue · sagittal · 0.33mm/px · 3 of 53 slices shown]
[im 18/53  brain]
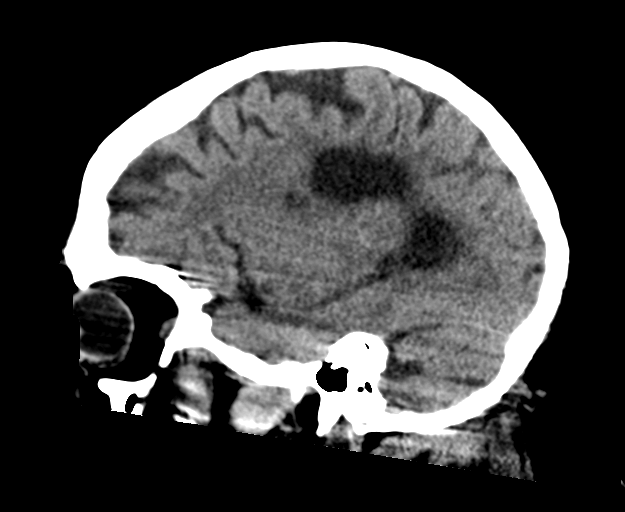
[im 27/53  brain]
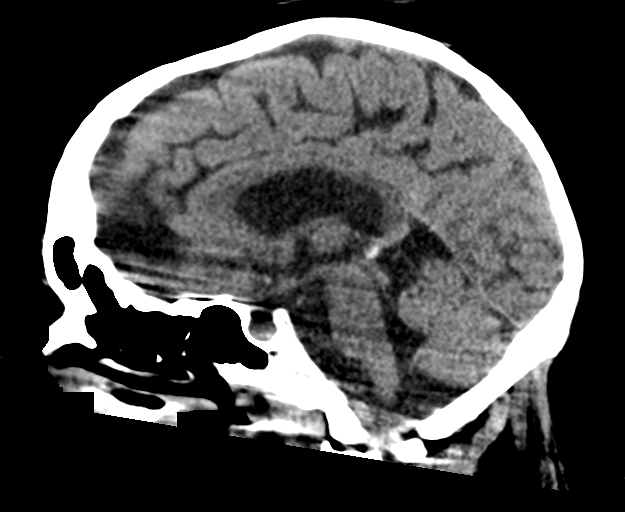
[im 35/53  brain]
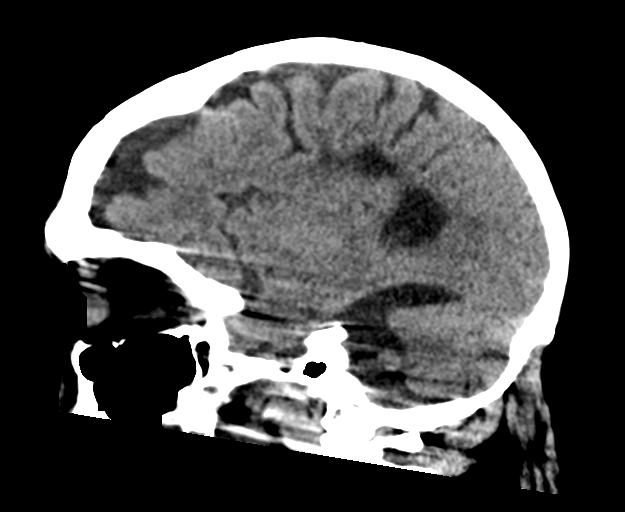

[16 of 47 positions shown; findings below may reference images not displayed]

FINDINGS: Brain: Cerebral volume is not significantly changed since 7597.
Mildly dysplastic appearing lateral ventricles with mild ex vacuo
enlargement, with superimposed corona radiata and deep white matter
capsule encephalomalacia on the left. Stable somewhat
disproportionate cerebellar volume loss, more so in the right
hemisphere which may relate to Wallerian degeneration.

No midline shift, mass effect, evidence of mass lesion, intracranial
hemorrhage or evidence of cortically based acute infarction.

Vascular: No suspicious intracranial vascular hyperdensity.

Skull: No acute osseous abnormality identified.

Sinuses/Orbits: Visualized paranasal sinuses and mastoids are stable
and well pneumatized.

Other: Disconjugate gaze similar to the prior study. No acute orbit
or scalp soft tissue findings. The
IMPRESSION: 1.  No acute intracranial abnormality.
2. Overall stable non contrast CT appearance of the brain since
7597.
Dysplastic appearing lateral ventricles, perhaps related to
congenital periventricular leukomalacia, with superimposed focal
chronic left hemisphere deep white matter encephalomalacia and
probable associated Wallerian degeneration of the right cerebellar
hemisphere.

## 2019-07-15 ENCOUNTER — Ambulatory Visit: Payer: Medicare Other | Admitting: Orthopaedic Surgery

## 2019-07-24 ENCOUNTER — Ambulatory Visit: Payer: Medicare Other | Admitting: Orthopaedic Surgery

## 2019-07-24 ENCOUNTER — Encounter: Payer: Self-pay | Admitting: Orthopaedic Surgery

## 2019-07-24 ENCOUNTER — Ambulatory Visit (INDEPENDENT_AMBULATORY_CARE_PROVIDER_SITE_OTHER): Payer: Medicare Other | Admitting: Orthopaedic Surgery

## 2019-07-24 ENCOUNTER — Ambulatory Visit: Payer: Self-pay

## 2019-07-24 ENCOUNTER — Other Ambulatory Visit: Payer: Self-pay

## 2019-07-24 DIAGNOSIS — S82131A Displaced fracture of medial condyle of right tibia, initial encounter for closed fracture: Secondary | ICD-10-CM | POA: Insufficient documentation

## 2019-07-24 NOTE — Progress Notes (Signed)
Office Visit Note   Patient: Hailey Gutierrez           Date of Birth: 1975-11-19           MRN: [id number] Visit Date: 07/24/2019              Requested by: Bernerd Limbo, MD [address] [place],  [place] [address] PCP: Bernerd Limbo, MD   Assessment & Plan: Visit Diagnoses:  1. Right medial tibial plateau fracture, closed, initial encounter     Plan: Impression is subacute right medial tibial plateau fracture.  Patient is nonambulatory and ambulates with a wheelchair and stretcher.  I have recommended knee immobilizer for transfers support and immobilization she does not need to wear this when in wheelchair or bed as this could cause pressure ulcers.  This was discussed with her caretaker.  We will see her back in 6 weeks with repeat 2 view x-rays of the right knee. Total face to face encounter time was greater than 45 minutes and over half of this time was spent in counseling and/or coordination of care.  Follow-Up Instructions: Return in about 6 weeks (around 09/04/2019).   Orders:  Orders Placed This Encounter  Procedures  . XR KNEE 3 VIEW RIGHT   No orders of the defined types were placed in this encounter.     Procedures: No procedures performed   Clinical Data: No additional findings.   Subjective: Chief Complaint  Patient presents with  . Right Knee - Pain    Patient is a 43 year old female with cerebral palsy comes in for evaluation of right leg pain.  Patient has severe cerebral palsy therefore unable to give details of the injury.  She is full assistance with every aspect of her ADLs.  She is accompanied by her CNA.  They are unsure as to when she may have injured her leg.   Review of Systems  Unable to perform ROS: Patient nonverbal     Objective: Vital Signs: There were no vitals taken for this visit.  Physical Exam Vitals signs and nursing note reviewed.  Constitutional:      Appearance: She is well-developed.   Pulmonary:     Effort: Pulmonary effort is normal.  Skin:    General: Skin is warm.     Capillary Refill: Capillary refill takes less than 2 seconds.  Neurological:     Mental Status: She is alert. Mental status is at baseline.     Ortho Exam Pain right knee exam shows no joint effusion.  She does have tenderness along the medial tibial plateau.  She is in varus alignment.  She has muscle atrophy due to be nonambulatory. Specialty Comments:  No specialty comments available.  Imaging: Xr Knee 3 View Right  Result Date: 07/24/2019 Subacute depressed medial tibial plateau fracture.  Varus deformity.    PMFS History: Patient Active Problem List   Diagnosis Date Noted  . Right medial tibial plateau fracture, closed, initial encounter 07/24/2019  . E. coli sepsis (HCC) 01/25/2018  . Sepsis due to undetermined organism (HCC) 01/23/2018  . UTI (urinary tract infection) 01/22/2018  . Elevated troponin 01/22/2018  . Acute metabolic encephalopathy 01/22/2018  . Unspecified intellectual disabilities   . PVD (peripheral vascular disease) (HCC)   . Essential hypertension   . Functional quadriplegia (HCC)   . Seizures (HCC)   . COPD (chronic obstructive pulmonary disease) (HCC)   . CHF (congestive heart failure) (HCC)   . Cerebral palsy (HCC)  Past Medical History:  Diagnosis Date  . Abnormal posture   . Allergic rhinitis   . Anxiety   . Atrial fibrillation ([place])   . Candidiasis of skin and nail   . Cardiac murmur   . Cataract   . Cerebral palsy ([place])   . CHF (congestive heart failure) ([place])   . Chronic pain   . Chronic pain syndrome   . Chronic sinusitis   . Cognitive communication deficit   . Contracture, unspecified ankle   . COPD (chronic obstructive pulmonary disease) ([place])   . Diabetes mellitus without complication ([place])   . Dry eye syndrome of bilateral lacrimal glands   . Dysphagia, oropharyngeal phase   . Epilepsy ([place])   . Essential hypertension   .  Functional quadriplegia (HCC)   . GERD (gastroesophageal reflux disease)   . Heart failure ([place])   . Localized edema   . Major depressive disorder   . Muscle weakness (generalized)   . Nail dystrophy   . Obesity   . Opioid dependence ([place])   . Osteoporosis   . Presbyopia   . PVD (peripheral vascular disease) ([place])   . Scoliosis   . Seborrheic dermatitis   . 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 Seizures ([place])   . Sinusitis   . Tinea unguium   . Unspecified intellectual disabilities   . Unspecified psychosis not due to a substance or known physiological condition [facility])     Family History  Problem Relation Age of Onset  . Heart attack Mother   . Diabetes Mother   . Chronic Renal Failure Mother   . Hypertension Father   . Hyperlipidemia Father     History reviewed. No pertinent surgical history. Social History   Occupational History  . Not on file  Tobacco Use  . Smoking status: Never Smoker  . Smokeless tobacco: Never Used  Substance and Sexual Activity  . Alcohol use: No  . Drug use: No  . Sexual activity: Not on file

## 2019-09-10 ENCOUNTER — Ambulatory Visit: Payer: Medicare Other | Admitting: Orthopaedic Surgery

## 2019-09-17 ENCOUNTER — Ambulatory Visit: Payer: Self-pay

## 2019-09-17 ENCOUNTER — Other Ambulatory Visit: Payer: Self-pay

## 2019-09-17 ENCOUNTER — Ambulatory Visit (INDEPENDENT_AMBULATORY_CARE_PROVIDER_SITE_OTHER): Payer: Medicare Other | Admitting: Orthopaedic Surgery

## 2019-09-17 ENCOUNTER — Encounter: Payer: Self-pay | Admitting: Orthopaedic Surgery

## 2019-09-17 DIAGNOSIS — S82131A Displaced fracture of medial condyle of right tibia, initial encounter for closed fracture: Secondary | ICD-10-CM

## 2019-09-17 NOTE — Progress Notes (Signed)
Office Visit Note   Patient: Hailey Gutierrez           Date of Birth: 08-31-1975           MRN: [id number] Visit Date: 09/17/2019              Requested by: Hilbert Corrigan, [address] [place],  [place] [address] PCP: Hilbert Corrigan, MD   Assessment & Plan: Visit Diagnoses:  1. Right medial tibial plateau fracture, closed, initial encounter     Plan: Impression is a clinically healed medial tibial plateau fracture.  At this point she can discontinue the immobilizer.  Her facility should still continue to be careful with transfers.  She may begin range of motion with physical therapy.  Fracture has healed at this point.  Follow-up as needed.  Follow-Up Instructions: Return if symptoms worsen or fail to improve.   Orders:  Orders Placed This Encounter  Procedures  . XR Knee 1-2 Views Right   No orders of the defined types were placed in this encounter.     Procedures: No procedures performed   Clinical Data: No additional findings.   Subjective: Chief Complaint  Patient presents with  . Right Knee - Pain    Patient follows up today for her right medial tibial plateau fracture.  She is nonambulatory at baseline.  She reports no pain today.   Review of Systems   Objective: Vital Signs: There were no vitals taken for this visit.  Physical Exam  Ortho Exam Right knee exam shows no tenderness to palpation.  No swelling.  Gentle range of motion does not elicit any pain. Specialty Comments:  No specialty comments available.  Imaging: XR Knee 1-2 Views Right  Result Date: 09/17/2019 Almost completely healed medial tibial plateau fracture.    PMFS History: Patient Active Problem List   Diagnosis Date Noted  . Right medial tibial plateau fracture, closed, initial encounter 07/24/2019  . E. coli sepsis ([place]) 01/25/2018  . Sepsis due to undetermined organism ([place]) 01/23/2018  . UTI (urinary tract infection) 01/22/2018  . Elevated  troponin 01/22/2018  . Acute metabolic encephalopathy 70/62/3762  . Unspecified intellectual disabilities   . PVD (peripheral vascular disease) ([place])   . Essential hypertension   . Functional quadriplegia (HCC)   . Seizures ([place])   . COPD (chronic obstructive pulmonary disease) ([place])   . CHF (congestive heart failure) ([place])   . Cerebral palsy [facility])    Past Medical History:  Diagnosis Date  . Abnormal posture   . Allergic rhinitis   . Anxiety   . Atrial fibrillation ([place])   . Candidiasis of skin and nail   . Cardiac murmur   . Cataract   . Cerebral palsy ([place])   . CHF (congestive heart failure) ([place])   . Chronic pain   . Chronic pain syndrome   . Chronic sinusitis   . Cognitive communication deficit   . Contracture, unspecified ankle   . COPD (chronic obstructive pulmonary disease) ([place])   . Diabetes mellitus without complication ([place])   . Dry eye syndrome of bilateral lacrimal glands   . Dysphagia, oropharyngeal phase   . Epilepsy ([place])   . Essential hypertension   . Functional quadriplegia (HCC)   . GERD (gastroesophageal reflux disease)   . Heart failure ([place])   . Localized edema   . Major depressive disorder   . Muscle weakness (generalized)   . Nail dystrophy   . Obesity   .  Opioid dependence (HCC)   . Osteoporosis   . Presbyopia   . PVD (peripheral vascular disease) (HCC)   . Scoliosis   . Seborrheic dermatitis   . Sedative hypnotic or anxiolytic dependence (HCC)   . Seizures (HCC)   . Sinusitis   . Tinea unguium   . Unspecified intellectual disabilities   . Unspecified psychosis not due to a substance or known physiological condition [facility]     Family History  Problem Relation Age of Onset  . Heart attack Mother   . Diabetes Mother   . Chronic Renal Failure Mother   . Hypertension Father   . Hyperlipidemia Father     History reviewed. No pertinent surgical history. Social History   Occupational History  . Not on file  Tobacco Use  . Smoking  status: Never Smoker  . Smokeless tobacco: Never Used  Substance and Sexual Activity  . Alcohol use: No  . Drug use: No  . Sexual activity: Not on file

## 2020-08-21 DEATH — deceased
